# Patient Record
Sex: Male | Born: 1965 | Race: White | Hispanic: No | Smoking: Never smoker
Health system: Southern US, Community
[De-identification: ages and names within clinical notes are randomized; demographics above are authoritative.]

## PROBLEM LIST (undated history)

## (undated) DIAGNOSIS — R188 Other ascites: Secondary | ICD-10-CM

## (undated) DIAGNOSIS — E871 Hypo-osmolality and hyponatremia: Secondary | ICD-10-CM

## (undated) DIAGNOSIS — K7460 Unspecified cirrhosis of liver: Secondary | ICD-10-CM

## (undated) DIAGNOSIS — H3320 Serous retinal detachment, unspecified eye: Secondary | ICD-10-CM

## (undated) DIAGNOSIS — D649 Anemia, unspecified: Secondary | ICD-10-CM

## (undated) HISTORY — DX: Unspecified cirrhosis of liver: K74.60

## (undated) HISTORY — PX: TONSILLECTOMY: SUR1361

## (undated) HISTORY — DX: Other ascites: R18.8

## (undated) HISTORY — PX: HERNIA REPAIR: SHX51

## (undated) HISTORY — PX: COLONOSCOPY: SHX174

## (undated) HISTORY — PX: KNEE SURGERY: SHX244

---

## 2003-09-15 ENCOUNTER — Emergency Department (HOSPITAL_COMMUNITY): Admission: EM | Admit: 2003-09-15 | Discharge: 2003-09-15 | Payer: Self-pay | Admitting: Emergency Medicine

## 2016-12-29 ENCOUNTER — Emergency Department (HOSPITAL_COMMUNITY): Payer: Medicaid Other

## 2016-12-29 ENCOUNTER — Inpatient Hospital Stay (HOSPITAL_COMMUNITY)
Admission: EM | Admit: 2016-12-29 | Discharge: 2017-01-05 | DRG: 433 | Disposition: A | Discharge status: Skilled Nursing Facility | Payer: Medicaid Other | Attending: Internal Medicine | Admitting: Internal Medicine

## 2016-12-29 ENCOUNTER — Encounter (HOSPITAL_COMMUNITY): Payer: Self-pay | Admitting: *Deleted

## 2016-12-29 DIAGNOSIS — R188 Other ascites: Secondary | ICD-10-CM

## 2016-12-29 DIAGNOSIS — D696 Thrombocytopenia, unspecified: Secondary | ICD-10-CM | POA: Diagnosis present

## 2016-12-29 DIAGNOSIS — Z6832 Body mass index (BMI) 32.0-32.9, adult: Secondary | ICD-10-CM

## 2016-12-29 DIAGNOSIS — K769 Liver disease, unspecified: Secondary | ICD-10-CM

## 2016-12-29 DIAGNOSIS — K7460 Unspecified cirrhosis of liver: Secondary | ICD-10-CM | POA: Diagnosis present

## 2016-12-29 DIAGNOSIS — E663 Overweight: Secondary | ICD-10-CM | POA: Diagnosis present

## 2016-12-29 DIAGNOSIS — D689 Coagulation defect, unspecified: Secondary | ICD-10-CM | POA: Diagnosis present

## 2016-12-29 DIAGNOSIS — R17 Unspecified jaundice: Secondary | ICD-10-CM

## 2016-12-29 DIAGNOSIS — M7989 Other specified soft tissue disorders: Secondary | ICD-10-CM

## 2016-12-29 DIAGNOSIS — K7689 Other specified diseases of liver: Secondary | ICD-10-CM | POA: Diagnosis present

## 2016-12-29 DIAGNOSIS — R0600 Dyspnea, unspecified: Secondary | ICD-10-CM

## 2016-12-29 DIAGNOSIS — R7989 Other specified abnormal findings of blood chemistry: Secondary | ICD-10-CM

## 2016-12-29 DIAGNOSIS — E860 Dehydration: Secondary | ICD-10-CM | POA: Diagnosis present

## 2016-12-29 DIAGNOSIS — D539 Nutritional anemia, unspecified: Secondary | ICD-10-CM | POA: Diagnosis present

## 2016-12-29 DIAGNOSIS — R945 Abnormal results of liver function studies: Secondary | ICD-10-CM | POA: Diagnosis present

## 2016-12-29 DIAGNOSIS — D72829 Elevated white blood cell count, unspecified: Secondary | ICD-10-CM | POA: Diagnosis present

## 2016-12-29 DIAGNOSIS — E871 Hypo-osmolality and hyponatremia: Secondary | ICD-10-CM | POA: Diagnosis present

## 2016-12-29 DIAGNOSIS — T380X5A Adverse effect of glucocorticoids and synthetic analogues, initial encounter: Secondary | ICD-10-CM | POA: Diagnosis present

## 2016-12-29 DIAGNOSIS — D649 Anemia, unspecified: Secondary | ICD-10-CM | POA: Diagnosis present

## 2016-12-29 DIAGNOSIS — K7031 Alcoholic cirrhosis of liver with ascites: Principal | ICD-10-CM

## 2016-12-29 DIAGNOSIS — K7290 Hepatic failure, unspecified without coma: Secondary | ICD-10-CM | POA: Diagnosis present

## 2016-12-29 DIAGNOSIS — E8770 Fluid overload, unspecified: Secondary | ICD-10-CM | POA: Diagnosis present

## 2016-12-29 DIAGNOSIS — K7010 Alcoholic hepatitis without ascites: Secondary | ICD-10-CM | POA: Diagnosis present

## 2016-12-29 DIAGNOSIS — K7011 Alcoholic hepatitis with ascites: Secondary | ICD-10-CM | POA: Diagnosis present

## 2016-12-29 DIAGNOSIS — E876 Hypokalemia: Secondary | ICD-10-CM | POA: Diagnosis present

## 2016-12-29 HISTORY — DX: Unspecified cirrhosis of liver: K74.60

## 2016-12-29 LAB — CBC WITH DIFFERENTIAL/PLATELET
BASOS ABS: 0 10*3/uL (ref 0.0–0.1)
BASOS PCT: 0 %
EOS ABS: 0.2 10*3/uL (ref 0.0–0.7)
Eosinophils Relative: 1 %
HCT: 25 % — ABNORMAL LOW (ref 39.0–52.0)
Hemoglobin: 8.4 g/dL — ABNORMAL LOW (ref 13.0–17.0)
Lymphocytes Relative: 5 %
Lymphs Abs: 0.6 10*3/uL — ABNORMAL LOW (ref 0.7–4.0)
MCH: 32.6 pg (ref 26.0–34.0)
MCHC: 33.6 g/dL (ref 30.0–36.0)
MCV: 96.9 fL (ref 78.0–100.0)
Monocytes Absolute: 1 10*3/uL (ref 0.1–1.0)
Monocytes Relative: 8 %
NEUTROS PCT: 86 %
Neutro Abs: 10.8 10*3/uL — ABNORMAL HIGH (ref 1.7–7.7)
PLATELETS: 127 10*3/uL — AB (ref 150–400)
RBC: 2.58 MIL/uL — ABNORMAL LOW (ref 4.22–5.81)
RDW: 18 % — ABNORMAL HIGH (ref 11.5–15.5)
WBC: 12.6 10*3/uL — AB (ref 4.0–10.5)

## 2016-12-29 LAB — COMPREHENSIVE METABOLIC PANEL
ALT: 33 U/L (ref 17–63)
ANION GAP: 11 (ref 5–15)
AST: 131 U/L — ABNORMAL HIGH (ref 15–41)
Albumin: 2.4 g/dL — ABNORMAL LOW (ref 3.5–5.0)
Alkaline Phosphatase: 244 U/L — ABNORMAL HIGH (ref 38–126)
BUN: 22 mg/dL — ABNORMAL HIGH (ref 6–20)
CHLORIDE: 97 mmol/L — AB (ref 101–111)
CO2: 23 mmol/L (ref 22–32)
CREATININE: 1.14 mg/dL (ref 0.61–1.24)
Calcium: 8.1 mg/dL — ABNORMAL LOW (ref 8.9–10.3)
Glucose, Bld: 107 mg/dL — ABNORMAL HIGH (ref 65–99)
POTASSIUM: 3.4 mmol/L — AB (ref 3.5–5.1)
Sodium: 131 mmol/L — ABNORMAL LOW (ref 135–145)
Total Bilirubin: 17.2 mg/dL — ABNORMAL HIGH (ref 0.3–1.2)
Total Protein: 7.1 g/dL (ref 6.5–8.1)

## 2016-12-29 LAB — URINALYSIS, ROUTINE W REFLEX MICROSCOPIC
Glucose, UA: 50 mg/dL — AB
Hgb urine dipstick: NEGATIVE
Ketones, ur: NEGATIVE mg/dL
LEUKOCYTES UA: NEGATIVE
NITRITE: NEGATIVE
Protein, ur: NEGATIVE mg/dL
SPECIFIC GRAVITY, URINE: 1.028 (ref 1.005–1.030)
pH: 5 (ref 5.0–8.0)

## 2016-12-29 LAB — RETICULOCYTES
RBC.: 2.58 MIL/uL — ABNORMAL LOW (ref 4.22–5.81)
RETIC COUNT ABSOLUTE: 74.8 10*3/uL (ref 19.0–186.0)
Retic Ct Pct: 2.9 % (ref 0.4–3.1)

## 2016-12-29 LAB — BRAIN NATRIURETIC PEPTIDE: B Natriuretic Peptide: 108.9 pg/mL — ABNORMAL HIGH (ref 0.0–100.0)

## 2016-12-29 LAB — PROTIME-INR
INR: 1.4
PROTHROMBIN TIME: 17.3 s — AB (ref 11.4–15.2)

## 2016-12-29 LAB — TROPONIN I: TROPONIN I: 0.03 ng/mL — AB (ref <0.03)

## 2016-12-29 LAB — BILIRUBIN, DIRECT: BILIRUBIN DIRECT: 9.3 mg/dL — AB (ref 0.1–0.5)

## 2016-12-29 MED ORDER — THIAMINE HCL 100 MG/ML IJ SOLN
100.0000 mg | Freq: Every day | INTRAMUSCULAR | Status: DC
  Filled 2016-12-29: qty 2

## 2016-12-29 MED ORDER — OXYCODONE HCL 5 MG PO TABS: 5.0000 mg | ORAL_TABLET | ORAL | Status: DC | PRN

## 2016-12-29 MED ORDER — IOPAMIDOL (ISOVUE-300) INJECTION 61%
INTRAVENOUS | Status: AC
  Administered 2016-12-29: 100 mL
  Filled 2016-12-29: qty 100

## 2016-12-29 MED ORDER — ACETAMINOPHEN 325 MG PO TABS: 650.0000 mg | ORAL_TABLET | Freq: Four times a day (QID) | ORAL | Status: DC | PRN

## 2016-12-29 MED ORDER — BISACODYL 5 MG PO TBEC: 5.0000 mg | DELAYED_RELEASE_TABLET | Freq: Every day | ORAL | Status: DC | PRN

## 2016-12-29 MED ORDER — ONDANSETRON HCL 4 MG/2ML IJ SOLN: 4.0000 mg | Freq: Four times a day (QID) | INTRAMUSCULAR | Status: DC | PRN

## 2016-12-29 MED ORDER — LORAZEPAM 2 MG/ML IJ SOLN: 1.0000 mg | Freq: Four times a day (QID) | INTRAMUSCULAR | Status: AC | PRN

## 2016-12-29 MED ORDER — SODIUM CHLORIDE 0.9% FLUSH
3.0000 mL | Freq: Two times a day (BID) | INTRAVENOUS | Status: DC
  Administered 2016-12-29 – 2017-01-05 (×11): 3 mL via INTRAVENOUS

## 2016-12-29 MED ORDER — LORAZEPAM 1 MG PO TABS
1.0000 mg | ORAL_TABLET | Freq: Four times a day (QID) | ORAL | Status: AC | PRN
  Administered 2017-01-01: 1 mg via ORAL
  Filled 2016-12-29: qty 1

## 2016-12-29 MED ORDER — LORAZEPAM 1 MG PO TABS
0.0000 mg | ORAL_TABLET | Freq: Four times a day (QID) | ORAL | Status: AC
  Administered 2016-12-30 (×3): 1 mg via ORAL
  Administered 2016-12-31: 2 mg via ORAL
  Administered 2016-12-31: 1 mg via ORAL
  Administered 2016-12-31 (×2): 2 mg via ORAL
  Filled 2016-12-29: qty 1
  Filled 2016-12-29: qty 2
  Filled 2016-12-29 (×2): qty 1
  Filled 2016-12-29 (×2): qty 2
  Filled 2016-12-29: qty 1
  Filled 2016-12-29: qty 2
  Filled 2016-12-29: qty 1

## 2016-12-29 MED ORDER — ADULT MULTIVITAMIN W/MINERALS CH
1.0000 | ORAL_TABLET | Freq: Every day | ORAL | Status: DC
  Administered 2016-12-31 – 2017-01-05 (×6): 1 via ORAL
  Filled 2016-12-29 (×6): qty 1

## 2016-12-29 MED ORDER — FUROSEMIDE 10 MG/ML IJ SOLN
20.0000 mg | Freq: Two times a day (BID) | INTRAMUSCULAR | Status: DC
  Administered 2016-12-30 – 2017-01-02 (×8): 20 mg via INTRAVENOUS
  Filled 2016-12-29 (×8): qty 2

## 2016-12-29 MED ORDER — POTASSIUM CHLORIDE CRYS ER 20 MEQ PO TBCR
30.0000 meq | EXTENDED_RELEASE_TABLET | Freq: Once | ORAL | Status: AC
  Administered 2016-12-30: 30 meq via ORAL
  Filled 2016-12-29: qty 1

## 2016-12-29 MED ORDER — ENOXAPARIN SODIUM 40 MG/0.4ML ~~LOC~~ SOLN
40.0000 mg | Freq: Every day | SUBCUTANEOUS | Status: DC
  Administered 2016-12-30 – 2017-01-03 (×5): 40 mg via SUBCUTANEOUS
  Filled 2016-12-29 (×6): qty 0.4

## 2016-12-29 MED ORDER — ONDANSETRON HCL 4 MG PO TABS: 4.0000 mg | ORAL_TABLET | Freq: Four times a day (QID) | ORAL | Status: DC | PRN

## 2016-12-29 MED ORDER — ALBUMIN HUMAN 25 % IV SOLN
25.0000 g | Freq: Four times a day (QID) | INTRAVENOUS | Status: AC
  Administered 2016-12-30 (×4): 25 g via INTRAVENOUS
  Filled 2016-12-29 (×5): qty 100

## 2016-12-29 MED ORDER — LORAZEPAM 1 MG PO TABS
0.0000 mg | ORAL_TABLET | Freq: Two times a day (BID) | ORAL | Status: AC
  Administered 2017-01-01 – 2017-01-02 (×2): 1 mg via ORAL
  Filled 2016-12-29 (×4): qty 1

## 2016-12-29 MED ORDER — FOLIC ACID 1 MG PO TABS
1.0000 mg | ORAL_TABLET | Freq: Every day | ORAL | Status: DC
  Administered 2016-12-31 – 2017-01-05 (×6): 1 mg via ORAL
  Filled 2016-12-29 (×6): qty 1

## 2016-12-29 MED ORDER — ACETAMINOPHEN 650 MG RE SUPP: 650.0000 mg | Freq: Four times a day (QID) | RECTAL | Status: DC | PRN

## 2016-12-29 MED ORDER — POLYETHYLENE GLYCOL 3350 17 G PO PACK
17.0000 g | PACK | Freq: Every day | ORAL | Status: DC | PRN
Start: 2016-12-29 — End: 2017-01-05

## 2016-12-29 MED ORDER — VITAMIN B-1 100 MG PO TABS
100.0000 mg | ORAL_TABLET | Freq: Every day | ORAL | Status: DC
  Administered 2016-12-31 – 2017-01-05 (×6): 100 mg via ORAL
  Filled 2016-12-29 (×6): qty 1

## 2016-12-29 MED ORDER — ALBUMIN HUMAN 25 % IV SOLN
50.0000 g | Freq: Once | INTRAVENOUS | Status: DC | PRN
  Filled 2016-12-29: qty 200

## 2016-12-29 NOTE — H&P (Signed)
History and Physical    Richard Bird LPF:[id number] DOB: 07-14-1966 DOA: 12/29/2016  PCP: Chevis Pretty, MD   Patient coming from: Home  Chief Complaint: Jaundice, abd distension, SOB, BLE edema   HPI: Richard Bird is a 51 y.o. male with medical history significant for long-standing alcohol dependence who presents the emergency department for evaluation of general weakness, shortness of breath, jaundice, abdominal distention, and bilateral lower extremity edema. Patient reports a long history of daily alcohol use, approximately 5-10 drinks per day and had otherwise been in his usual state of health until approximately 3 weeks ago when he noted the insidious development of right lower extremity swelling. Over the next week or so, he developed swelling in the left leg as well and also noted progressive abdominal distention and yellowing of his skin and eyes. Over the ensuing days, all of these complaints were progressively worsening and the patient was becoming increasingly short of breath with exertion. He denies any fevers or chills, denies chest pain or palpitations, and denies dyspnea while at rest, or cough. There has been no significant nausea, no vomiting, no diarrhea, and no melena or hematochezia. Patient denies any use of illicit substances and denies any history of IV drug abuse. He denies a family history of liver disease. He reports abstaining from alcohol for up to a couple months at a time remotely, but has not gone a day without drinking in many months. He denies history of DTs or seizure, but has noted mild withdrawal symptoms in the past which she has relieved with drinking.  ED Course: Upon arrival to the ED, patient is found to be afebrile, saturating well on room air, tachycardic and low 100s, and mildly hypertensive. EKG featured sinus tachycardia with rate 101 and nonspecific T-wave abnormality in the inferior leads. Chest x-ray features low-volume lungs with bibasilar  atelectasis versus scarring. Chemistry panels notable for sodium of 131, potassium 3.4, alkaline phosphatase 244, albumin 2.4, AST 131, ALT 33, and total bilirubin 17.2. CBC features a leukocytosis to 12,600 with a normocytic anemia and thrombocytopenia, hemoglobin 8.4 and platelets 127,000 with no priors available for comparison. CT of the abdomen and pelvis was obtained and findings are consistent with cirrhosis with moderate volume ascites and evidence of portal hypertension. Also noted on CT is 2.4 cm hypodense lesion in the central liver with MRI advised for further evaluation. Patient remained hematologically stable in the ED and has not been in acute respiratory distress. He will be admitted to the telemetry unit for ongoing evaluation and management of acute alcoholic hepatitis/decompensated cirrhosis with anasarca.  Review of Systems:  All other systems reviewed and apart from HPI, are negative.  History reviewed. No pertinent past medical history.  Past Surgical History:  Procedure Laterality Date  . HERNIA REPAIR    . KNEE SURGERY       reports that he has never smoked. He has never used smokeless tobacco. He reports that he drinks about 25.2 oz of alcohol per week . He reports that he does not use drugs.  Allergies  Allergen Reactions  . Penicillins     51 years old, unknown reaction     History reviewed. No pertinent family history.   Prior to Admission medications   Medication Sig Start Date End Date Taking? Authorizing Provider  Naproxen Sod-Diphenhydramine (ALEVE PM) 220-25 MG TABS Take 1 tablet by mouth at bedtime as needed (sleep).   Yes Historical Provider, MD    Physical Exam: Vitals:   12/29/16 2144 12/29/16  2200 12/29/16 2217 12/29/16 2230  BP: 130/87 (!) 151/85 (!) 151/85 (!) 154/90  Pulse: (!) 124 (!) 103 99 (!) 105  Resp: (!) 26 18 20 15   Temp:      TempSrc:      SpO2: 96% 96% 97% 95%  Weight:          Constitutional: No respiratory distress, calm,  appears uncomfortable. Jaundice, anasarca noted Eyes: PERTLA, lids normal. Scleral icterus.  ENMT: Mucous membranes are moist. Posterior pharynx clear of any exudate or lesions.   Neck: normal, supple, no masses, no thyromegaly Respiratory: clear to auscultation bilaterally, no wheezing, no crackles. Normal respiratory effort.   Cardiovascular: S1 & S2 heard, regular rate and rhythm. Marked pretibial edema b/l, Rt >> Lt. No significant JVD. Abdomen: Distended with fluid wave, mild generalized tenderness without rebound pain or guarding. Bowel sounds normal.  Musculoskeletal: no clubbing / cyanosis. No joint deformity upper and lower extremities.   Skin: no significant rashes, lesions, ulcers. Warm, dry, well-perfused. Jaundiced throughout.  Neurologic: CN 2-12 grossly intact. Sensation intact, DTR normal. Strength 5/5 in all 4 limbs.  Psychiatric: Alert and oriented x 3. Normal mood and affect.     Labs on Admission: I have personally reviewed following labs and imaging studies  CBC:  Recent Labs Lab 12/29/16 1940  WBC 12.6*  NEUTROABS 10.8*  HGB 8.4*  HCT 25.0*  MCV 96.9  PLT 211*   Basic Metabolic Panel:  Recent Labs Lab 12/29/16 1940  NA 131*  K 3.4*  CL 97*  CO2 23  GLUCOSE 107*  BUN 22*  CREATININE 1.14  CALCIUM 8.1*   GFR: CrCl cannot be calculated (Unknown ideal weight.). Liver Function Tests:  Recent Labs Lab 12/29/16 1940  AST 131*  ALT 33  ALKPHOS 244*  BILITOT 17.2*  PROT 7.1  ALBUMIN 2.4*   No results for input(s): LIPASE, AMYLASE in the last 168 hours. No results for input(s): AMMONIA in the last 168 hours. Coagulation Profile:  Recent Labs Lab 12/29/16 1940  INR 1.40   Cardiac Enzymes:  Recent Labs Lab 12/29/16 1940  TROPONINI 0.03*   BNP (last 3 results) No results for input(s): PROBNP in the last 8760 hours. HbA1C: No results for input(s): HGBA1C in the last 72 hours. CBG: No results for input(s): GLUCAP in the last 168  hours. Lipid Profile: No results for input(s): CHOL, HDL, LDLCALC, TRIG, CHOLHDL, LDLDIRECT in the last 72 hours. Thyroid Function Tests: No results for input(s): TSH, T4TOTAL, FREET4, T3FREE, THYROIDAB in the last 72 hours. Anemia Panel: No results for input(s): VITAMINB12, FOLATE, FERRITIN, TIBC, IRON, RETICCTPCT in the last 72 hours. Urine analysis:    Component Value Date/Time   COLORURINE AMBER (A) 12/29/2016 2150   APPEARANCEUR HAZY (A) 12/29/2016 2150   LABSPEC 1.028 12/29/2016 2150   PHURINE 5.0 12/29/2016 2150   GLUCOSEU 50 (A) 12/29/2016 2150   HGBUR NEGATIVE 12/29/2016 2150   BILIRUBINUR MODERATE (A) 12/29/2016 2150   KETONESUR NEGATIVE 12/29/2016 2150   PROTEINUR NEGATIVE 12/29/2016 2150   NITRITE NEGATIVE 12/29/2016 2150   LEUKOCYTESUR NEGATIVE 12/29/2016 2150   Sepsis Labs: @LABRCNTIP (procalcitonin:4,lacticidven:4) )No results found for this or any previous visit (from the past 240 hour(s)).   Radiological Exams on Admission: Dg Chest 2 View  Result Date: 12/29/2016 CLINICAL DATA:  Shortness of breath, fatigue, and lower extremity swelling for 2 weeks. Jaundice. EXAM: CHEST  2 VIEW COMPARISON:  None. FINDINGS: The heart size and mediastinal contours are within normal limits. Low lung volumes  noted. Linear opacities in both lung bases may be due to atelectasis or scarring. No evidence of pulmonary consolidation or edema. No evidence of pneumothorax or pleural effusion. IMPRESSION: Low lung volumes, with bibasilar atelectasis versus scarring. Electronically Signed   By: Earle Gell M.D.   On: 12/29/2016 20:04   Ct Abdomen Pelvis W Contrast  Result Date: 12/29/2016 CLINICAL DATA:  Initial evaluation for acute generalized weakness with fatigue. Evaluate for cirrhosis. EXAM: CT ABDOMEN AND PELVIS WITH CONTRAST TECHNIQUE: Multidetector CT imaging of the abdomen and pelvis was performed using the standard protocol following bolus administration of intravenous contrast.  CONTRAST:  122m ISOVUE-300 IOPAMIDOL (ISOVUE-300) INJECTION 61% COMPARISON:  None available. FINDINGS: Lower chest: Scattered patchy and linear atelectatic changes present within the visualized lung bases. Visualized lung bases are otherwise clear. Extensive 3 vessel coronary artery calcifications noted. Hepatobiliary: The liver is heterogeneous in appearance with a nodular contour and caudate lobe hypertrophy, compatible with cirrhosis. Superimposed 2.4 cm hypodense lesion noted within the central aspect of the liver (series 2, image 28), indeterminate. No other definite focal lesions identified. Small granuloma noted. Evidence of portal hypertension with the recannulized paraumbilical vein. Large varix noted within the left abdomen as well. Few scattered smaller varices present within the upper abdomen. Trace hyperdensity within the gallbladder lumen, likely small stone. No biliary dilatation. Pancreas: Pancreas within normal limits. Spleen: Spleen is enlarged measuring 15.2 cm in craniocaudad dimension. Adrenals/Urinary Tract: Adrenal glands are normal. Kidneys equal in size with symmetric enhancement. No nephrolithiasis, hydronephrosis, or focal enhancing renal mass. No hydroureter. Bladder within normal limits. Stomach/Bowel: Stomach within normal limits. No evidence for bowel obstruction. Scattered hazy stranding and fluid about the small and large bowel felt to be related to underlying intrinsic liver disease. Colonic diverticulosis without definite evidence for acute diverticulitis. Appendix is normal. No definite acute inflammatory changes about the bowels. Vascular/Lymphatic: Extensive aorto bi-iliac atherosclerotic disease. No aneurysm. No pathologically enlarged intra-abdominal or pelvic lymph nodes. Reproductive: Prostate somewhat enlarged with median lobe hypertrophy, invaginating upon the base of the bladder. Other: No free intraperitoneal air. Moderate volume ascites within the abdomen and pelvis.  Small amount of fluid extends through a small paraumbilical hernia. Musculoskeletal:  Anasarca noted. No acute osseous abnormality. No worrisome lytic or blastic osseous lesions. Degenerative spondylolysis noted within the lumbar spine, greatest at L5-S1. IMPRESSION: 1. Cirrhosis with moderate volume ascites and evidence of portal hypertension as detailed above. 2. 2.4 cm hypodense lesion within the central aspect of the liver, indeterminate. Given the underlying intrinsic liver disease, further evaluation with dedicated MRI is recommended. 3. Colonic diverticulosis without definite evidence for acute diverticulitis. 4. Cholelithiasis. 5. Extensive coronary artery calcifications with moderate aorto bi-iliac atherosclerotic disease. Electronically Signed   By: BJeannine BogaM.D.   On: 12/29/2016 21:34    EKG: Independently reviewed. Sinus tachycardia (rate 101), non-specific T-wave abnormality inferiorly   Assessment/Plan  1. Alcoholic hepatitis/decompensated cirrhosis  - Pt with long hx of heavy daily alcohol use, now p/w jaundice, ascites, anasarca - Noted to have t bili 17 with alk phos 244, AST 131, and ALT 33, INR 1.40 - CT abd/pelvis reveals cirrhotic liver, evidence for portal HTN, and central hypodense liver lesion - Pt was counseled regarding critical importance of abstinence from alcohol going forward; family at bedside are very supportive - Plan to start acid-suppression, replete lytes, supplement vitamins and minerals, monitor with CIWA and prn Ativan - US-guided paracentesis with fluid analysis requested   - Maddrey discriminant function score is 39  on admission; viral hep panel pending, and if neg, may benefit from steroid    2. Liver lesion  - 2.4 cm hypodense lesion noted in central liver on CT - Further characterize with MRI, check AFP   3. Hyponatremia - Serum sodium 131 on admission - Secondary to cirrhosis with reduced effective arterial blood volume - Repeat chem  panel in am   4. Hypokalemia  - Serum potassium is 3.4 on admission - 30 mEq oral given; repeat chem panel in am   5. Coagulapathy  - INR 1.40 on admission without any apparent bleeding  - Likely secondary to liver disease; will monitor   6. Anemia, thrombocytopenia  - Hgb is 8.4 on admission and platelets 127k  - There is no apparent bleeding, and no pallor  - Likely secondary to alcohol abuse and liver disease - Type and screen performed; anemia panel sent   7. Anasarca  - Pt presents with anasarca secondary to decompensated cirrhosis  - Planning for paracentesis as above  - Albumin only 2.4 and albumin will be given q6h with Lasix 20 mg IV q12h  - Follow daily wts and I/O's  - RLE much larger than left and venous US will be performed to exclude DVT    DVT prophylaxis: sq Lovenox  Code Status: Full  Family Communication: Wife and son updated at bedside Disposition Plan: Admit to telemetry Consults called: None Admission status: Inpatient    Vianne Bulls, MD [facility] Pager [phone]  If 7PM-7AM, please contact night-coverage [email or web address] Password [facility]  12/29/2016, 10:48 PM

## 2016-12-29 NOTE — ED Provider Notes (Signed)
[facility]-EMERGENCY DEPT Provider Note   CSN: [id number] Arrival date & time: 12/29/16  1837     History   Chief Complaint Chief Complaint  Patient presents with  . Jaundice    HPI Richard Bird is a 51 y.o. male.  51 year old male with a long-standing alcohol history drugs currently 6 shots a day presents to emergency department today with jaundice and lower show any swelling with abdominal distention. He states it started about 2 weeks ago and progressively worsening. Has never had this before. No weight loss or change in appetite. No significant pruritus. No other associated or modifying symptoms. No family or personal history of cancer.      History reviewed. No pertinent past medical history.  Patient Active Problem List   Diagnosis Date Noted  . Alcoholic cirrhosis of liver with ascites (HCC) 12/29/2016  . Hyponatremia 12/29/2016  . Hypokalemia 12/29/2016  . LFTs abnormal 12/29/2016  . Coagulopathy (HCC) 12/29/2016  . Normocytic anemia 12/29/2016  . Decompensated hepatic cirrhosis (HCC) 12/29/2016  . Hepatitis, alcoholic, acute 12/29/2016    Past Surgical History:  Procedure Laterality Date  . HERNIA REPAIR    . KNEE SURGERY         Home Medications    Prior to Admission medications   Medication Sig Start Date End Date Taking? Authorizing Provider  Naproxen Sod-Diphenhydramine (ALEVE PM) 220-25 MG TABS Take 1 tablet by mouth at bedtime as needed (sleep).   Yes Historical Provider, MD    Family History History reviewed. No pertinent family history.  Social History Social History  Substance Use Topics  . Smoking status: Never Smoker  . Smokeless tobacco: Never Used  . Alcohol use 25.2 oz/week    42 Shots of liquor per week     Allergies   Penicillins   Review of Systems Review of Systems  All other systems reviewed and are negative.    Physical Exam Updated Vital Signs BP (!) 154/90   Pulse (!) 105   Temp 98.1 F (36.7 C) (Oral)    Resp 15   Wt 237 lb 9.6 oz (107.8 kg)   SpO2 95%   Physical Exam  Constitutional: He appears well-developed and well-nourished.  HENT:  Head: Normocephalic and atraumatic.  Eyes: EOM are normal. Pupils are equal, round, and reactive to light. Scleral icterus is present.  Neck: Normal range of motion.  Cardiovascular: Tachycardia present.   Pulmonary/Chest: Effort normal. No respiratory distress.  Abdominal: He exhibits distension. There is no tenderness. There is no guarding.  Musculoskeletal: Normal range of motion. He exhibits edema (2+ to knees).  Neurological: He is alert.  Skin:  Jaundice  Nursing note and vitals reviewed.    ED Treatments / Results  Labs (all labs ordered are listed, but only abnormal results are displayed) Labs Reviewed  CBC WITH DIFFERENTIAL/PLATELET - Abnormal; Notable for the following:       Result Value   WBC 12.6 (*)    RBC 2.58 (*)    Hemoglobin 8.4 (*)    HCT 25.0 (*)    RDW 18.0 (*)    Platelets 127 (*)    Neutro Abs 10.8 (*)    Lymphs Abs 0.6 (*)    All other components within normal limits  COMPREHENSIVE METABOLIC PANEL - Abnormal; Notable for the following:    Sodium 131 (*)    Potassium 3.4 (*)    Chloride 97 (*)    Glucose, Bld 107 (*)    BUN 22 (*)  Calcium 8.1 (*)    Albumin 2.4 (*)    AST 131 (*)    Alkaline Phosphatase 244 (*)    Total Bilirubin 17.2 (*)    All other components within normal limits  URINALYSIS, ROUTINE W REFLEX MICROSCOPIC - Abnormal; Notable for the following:    Color, Urine AMBER (*)    APPearance HAZY (*)    Glucose, UA 50 (*)    Bilirubin Urine MODERATE (*)    All other components within normal limits  BILIRUBIN, DIRECT - Abnormal; Notable for the following:    Bilirubin, Direct 9.3 (*)    All other components within normal limits  PROTIME-INR - Abnormal; Notable for the following:    Prothrombin Time 17.3 (*)    All other components within normal limits  BRAIN NATRIURETIC PEPTIDE - Abnormal;  Notable for the following:    B Natriuretic Peptide 108.9 (*)    All other components within normal limits  TROPONIN I - Abnormal; Notable for the following:    Troponin I 0.03 (*)    All other components within normal limits  HEPATITIS PANEL, ACUTE  HIV ANTIBODY (ROUTINE TESTING)  MAGNESIUM  COMPREHENSIVE METABOLIC PANEL  CBC WITH DIFFERENTIAL/PLATELET  PROTIME-INR  VITAMIN B12  FOLATE  IRON AND TIBC  FERRITIN  RETICULOCYTES  TYPE AND SCREEN    EKG  EKG Interpretation  Date/Time:  Wednesday December 29 2016 19:30:13 EDT Ventricular Rate:  101 PR Interval:    QRS Duration: 95 QT Interval:  360 QTC Calculation: 467 R Axis:   60 Text Interpretation:  Age not entered, assumed to be  51 years old for purpose of ECG interpretation Sinus tachycardia Probable left atrial enlargement Nonspecific T abnormalities, inferior leads somewhat similar to 2004 Confirmed by [facility] MD, Barbara Cower [phone]) on 12/29/2016 8:04:40 PM       Radiology Dg Chest 2 View  Result Date: 12/29/2016 CLINICAL DATA:  Shortness of breath, fatigue, and lower extremity swelling for 2 weeks. Jaundice. EXAM: CHEST  2 VIEW COMPARISON:  None. FINDINGS: The heart size and mediastinal contours are within normal limits. Low lung volumes noted. Linear opacities in both lung bases may be due to atelectasis or scarring. No evidence of pulmonary consolidation or edema. No evidence of pneumothorax or pleural effusion. IMPRESSION: Low lung volumes, with bibasilar atelectasis versus scarring. Electronically Signed   By: Myles Rosenthal M.D.   On: 12/29/2016 20:04   Ct Abdomen Pelvis W Contrast  Result Date: 12/29/2016 CLINICAL DATA:  Initial evaluation for acute generalized weakness with fatigue. Evaluate for cirrhosis. EXAM: CT ABDOMEN AND PELVIS WITH CONTRAST TECHNIQUE: Multidetector CT imaging of the abdomen and pelvis was performed using the standard protocol following bolus administration of intravenous contrast. CONTRAST:   ISOVUE-300 IOPAMIDOL (ISOVUE-300) INJECTION 61% COMPARISON:  None available. FINDINGS: Lower chest: Scattered patchy and linear atelectatic changes present within the visualized lung bases. Visualized lung bases are otherwise clear. Extensive 3 vessel coronary artery calcifications noted. Hepatobiliary: The liver is heterogeneous in appearance with a nodular contour and caudate lobe hypertrophy, compatible with cirrhosis. Superimposed 2.4 cm hypodense lesion noted within the central aspect of the liver (series 2, image 28), indeterminate. No other definite focal lesions identified. Small granuloma noted. Evidence of portal hypertension with the recannulized paraumbilical vein. Large varix noted within the left abdomen as well. Few scattered smaller varices present within the upper abdomen. Trace hyperdensity within the gallbladder lumen, likely small stone. No biliary dilatation. Pancreas: Pancreas within normal limits. Spleen: Spleen is enlarged measuring  15.2 cm in craniocaudad dimension. Adrenals/Urinary Tract: Adrenal glands are normal. Kidneys equal in size with symmetric enhancement. No nephrolithiasis, hydronephrosis, or focal enhancing renal mass. No hydroureter. Bladder within normal limits. Stomach/Bowel: Stomach within normal limits. No evidence for bowel obstruction. Scattered hazy stranding and fluid about the small and large bowel felt to be related to underlying intrinsic liver disease. Colonic diverticulosis without definite evidence for acute diverticulitis. Appendix is normal. No definite acute inflammatory changes about the bowels. Vascular/Lymphatic: Extensive aorto bi-iliac atherosclerotic disease. No aneurysm. No pathologically enlarged intra-abdominal or pelvic lymph nodes. Reproductive: Prostate somewhat enlarged with median lobe hypertrophy, invaginating upon the base of the bladder. Other: No free intraperitoneal air. Moderate volume ascites within the abdomen and pelvis. Small amount of  fluid extends through a small paraumbilical hernia. Musculoskeletal:  Anasarca noted. No acute osseous abnormality. No worrisome lytic or blastic osseous lesions. Degenerative spondylolysis noted within the lumbar spine, greatest at L5-S1. IMPRESSION: 1. Cirrhosis with moderate volume ascites and evidence of portal hypertension as detailed above. 2. 2.4 cm hypodense lesion within the central aspect of the liver, indeterminate. Given the underlying intrinsic liver disease, further evaluation with dedicated MRI is recommended. 3. Colonic diverticulosis without definite evidence for acute diverticulitis. 4. Cholelithiasis. 5. Extensive coronary artery calcifications with moderate aorto bi-iliac atherosclerotic disease. Electronically Signed   By: Rise Mu M.D.   On: 12/29/2016 21:34    Procedures Procedures (including critical care time)  Medications Ordered in ED Medications  LORazepam (ATIVAN) tablet 1 mg (not administered)    Or  LORazepam (ATIVAN) injection 1 mg (not administered)  thiamine (VITAMIN B-1) tablet 100 mg (not administered)    Or  thiamine (B-1) injection 100 mg (not administered)  folic acid (FOLVITE) tablet 1 mg (not administered)  multivitamin with minerals tablet 1 tablet (not administered)  LORazepam (ATIVAN) tablet 0-4 mg (not administered)    Followed by  LORazepam (ATIVAN) tablet 0-4 mg (not administered)  enoxaparin (LOVENOX) injection 40 mg (not administered)  sodium chloride flush (NS) 0.9 % injection 3 mL (not administered)  acetaminophen (TYLENOL) tablet 650 mg (not administered)    Or  acetaminophen (TYLENOL) suppository 650 mg (not administered)  oxyCODONE (Oxy IR/ROXICODONE) immediate release tablet 5-10 mg (not administered)  polyethylene glycol (MIRALAX / GLYCOLAX) packet 17 g (not administered)  bisacodyl (DULCOLAX) EC tablet 5 mg (not administered)  ondansetron (ZOFRAN) tablet 4 mg (not administered)    Or  ondansetron (ZOFRAN) injection 4 mg  (not administered)  albumin human 25 % solution 50 g (not administered)  albumin human 25 % solution 25 g (not administered)  furosemide (LASIX) injection 20 mg (not administered)  potassium chloride (K-DUR,KLOR-CON) CR tablet 30 mEq (not administered)  iopamidol (ISOVUE-300) 61 % injection (100 mLs  Contrast Given 12/29/16 2049)     Initial Impression / Assessment and Plan / ED Course  I have reviewed the triage vital signs and the nursing notes.  Pertinent labs & imaging results that were available during my care of the patient were reviewed by me and considered in my medical decision making (see chart for details).     Liver failure with likely heart failure as well. Likely 2/2 cirrhosis/alcohol but has some hypodense lesion that could be related to cancer, recommending MRI. Also needs cardiac workup so will admit for same.   Final Clinical Impressions(s) / ED Diagnoses   Final diagnoses:  Hyperbilirubinemia  Jaundice  Cirrhosis of liver with ascites, unspecified hepatic cirrhosis type (HCC)  Swelling of lower extremity  Marily Memos, MD 12/29/16 [phone]

## 2016-12-29 NOTE — ED Notes (Signed)
Pt has a urinal and will give [place] a specimen as soon as he is able

## 2016-12-29 NOTE — ED Notes (Signed)
Pt stated he still cannot urinate

## 2016-12-29 NOTE — ED Notes (Signed)
ED Provider at bedside. 

## 2016-12-29 NOTE — ED Triage Notes (Signed)
Pt is here due to generalized weakness and fatigue which he notes has been increasing x 2weeks.  Pt is jaundiced with very yellowed eyes and skin x2 weeks.  Pt has bilateral lower extremity edema which has been in place for 3 weeks. Pt also has distended hard abdomen (appearance of ascites).  Pt also notes that he becomes sob when ambulating up stairs.  Pt drinks daily and has about 6 shots of liquor each day.  Pt had his last drink last night.

## 2016-12-29 NOTE — ED Notes (Signed)
Patient transported to X-ray 

## 2016-12-30 ENCOUNTER — Inpatient Hospital Stay (HOSPITAL_COMMUNITY): Payer: Medicaid Other

## 2016-12-30 DIAGNOSIS — M7989 Other specified soft tissue disorders: Secondary | ICD-10-CM

## 2016-12-30 DIAGNOSIS — K769 Liver disease, unspecified: Secondary | ICD-10-CM

## 2016-12-30 DIAGNOSIS — K7031 Alcoholic cirrhosis of liver with ascites: Principal | ICD-10-CM

## 2016-12-30 DIAGNOSIS — R17 Unspecified jaundice: Secondary | ICD-10-CM

## 2016-12-30 LAB — CBC WITH DIFFERENTIAL/PLATELET
BASOS PCT: 0 %
Basophils Absolute: 0 10*3/uL (ref 0.0–0.1)
Eosinophils Absolute: 0.2 10*3/uL (ref 0.0–0.7)
Eosinophils Relative: 1 %
HCT: 21.8 % — ABNORMAL LOW (ref 39.0–52.0)
HEMOGLOBIN: 7.4 g/dL — AB (ref 13.0–17.0)
LYMPHS ABS: 1 10*3/uL (ref 0.7–4.0)
Lymphocytes Relative: 7 %
MCH: 33.5 pg (ref 26.0–34.0)
MCHC: 33.9 g/dL (ref 30.0–36.0)
MCV: 98.6 fL (ref 78.0–100.0)
MONO ABS: 0.5 10*3/uL (ref 0.1–1.0)
MONOS PCT: 4 %
NEUTROS ABS: 11.2 10*3/uL — AB (ref 1.7–7.7)
NEUTROS PCT: 87 %
Platelets: 109 10*3/uL — ABNORMAL LOW (ref 150–400)
RBC: 2.21 MIL/uL — ABNORMAL LOW (ref 4.22–5.81)
RDW: 18.4 % — ABNORMAL HIGH (ref 11.5–15.5)
WBC Morphology: INCREASED
WBC: 12.4 10*3/uL — ABNORMAL HIGH (ref 4.0–10.5)

## 2016-12-30 LAB — PROTIME-INR
INR: 1.41
PROTHROMBIN TIME: 17.3 s — AB (ref 11.4–15.2)

## 2016-12-30 LAB — FERRITIN: Ferritin: 73 ng/mL (ref 24–336)

## 2016-12-30 LAB — BODY FLUID CELL COUNT WITH DIFFERENTIAL
Lymphs, Fluid: 16 %
Monocyte-Macrophage-Serous Fluid: 82 % (ref 50–90)
NEUTROPHIL FLUID: 2 % (ref 0–25)
Total Nucleated Cell Count, Fluid: 91 cu mm (ref 0–1000)

## 2016-12-30 LAB — COMPREHENSIVE METABOLIC PANEL
ALBUMIN: 2.5 g/dL — AB (ref 3.5–5.0)
ALK PHOS: 224 U/L — AB (ref 38–126)
ALT: 31 U/L (ref 17–63)
ANION GAP: 12 (ref 5–15)
AST: 129 U/L — ABNORMAL HIGH (ref 15–41)
BUN: 22 mg/dL — ABNORMAL HIGH (ref 6–20)
CALCIUM: 7.9 mg/dL — AB (ref 8.9–10.3)
CHLORIDE: 98 mmol/L — AB (ref 101–111)
CO2: 21 mmol/L — AB (ref 22–32)
CREATININE: 1.01 mg/dL (ref 0.61–1.24)
GFR calc Af Amer: >60 mL/min (ref >60)
GFR calc non Af Amer: >60 mL/min (ref >60)
GLUCOSE: 105 mg/dL — AB (ref 65–99)
Potassium: 3.3 mmol/L — ABNORMAL LOW (ref 3.5–5.1)
SODIUM: 131 mmol/L — AB (ref 135–145)
Total Bilirubin: 16.8 mg/dL — ABNORMAL HIGH (ref 0.3–1.2)
Total Protein: 7.2 g/dL (ref 6.5–8.1)

## 2016-12-30 LAB — TYPE AND SCREEN
ABO/RH(D): AB NEG
ANTIBODY SCREEN: NEGATIVE

## 2016-12-30 LAB — ALBUMIN, PLEURAL OR PERITONEAL FLUID: Albumin, Fluid: <1 g/dL

## 2016-12-30 LAB — GLUCOSE, PLEURAL OR PERITONEAL FLUID: Glucose, Fluid: 95 mg/dL

## 2016-12-30 LAB — LACTATE DEHYDROGENASE, PLEURAL OR PERITONEAL FLUID: LD FL: 55 U/L — AB (ref 3–23)

## 2016-12-30 LAB — HIV ANTIBODY (ROUTINE TESTING W REFLEX): HIV Screen 4th Generation wRfx: NONREACTIVE

## 2016-12-30 LAB — FOLATE: FOLATE: 14.6 ng/mL (ref >5.9)

## 2016-12-30 LAB — PROTEIN, PLEURAL OR PERITONEAL FLUID: Total protein, fluid: <3 g/dL

## 2016-12-30 LAB — IRON AND TIBC
IRON: 30 ug/dL — AB (ref 45–182)
Saturation Ratios: 12 % — ABNORMAL LOW (ref 17.9–39.5)
TIBC: 246 ug/dL — AB (ref 250–450)
UIBC: 216 ug/dL

## 2016-12-30 LAB — MAGNESIUM: MAGNESIUM: 1.7 mg/dL (ref 1.7–2.4)

## 2016-12-30 LAB — ABO/RH: ABO/RH(D): AB NEG

## 2016-12-30 LAB — VITAMIN B12: VITAMIN B 12: 1748 pg/mL — AB (ref 180–914)

## 2016-12-30 MED ORDER — FAMOTIDINE 20 MG PO TABS
20.0000 mg | ORAL_TABLET | Freq: Two times a day (BID) | ORAL | Status: DC
  Administered 2016-12-30 – 2017-01-05 (×11): 20 mg via ORAL
  Filled 2016-12-30 (×12): qty 1

## 2016-12-30 MED ORDER — GADOXETATE DISODIUM 0.25 MMOL/ML IV SOLN
10.0000 mL | Freq: Once | INTRAVENOUS | Status: AC | PRN
  Administered 2016-12-30: 10 mL via INTRAVENOUS

## 2016-12-30 MED ORDER — POTASSIUM CHLORIDE CRYS ER 20 MEQ PO TBCR
40.0000 meq | EXTENDED_RELEASE_TABLET | Freq: Two times a day (BID) | ORAL | Status: DC
  Administered 2016-12-30 – 2017-01-03 (×8): 40 meq via ORAL
  Filled 2016-12-30 (×8): qty 2

## 2016-12-30 NOTE — Procedures (Signed)
Ultrasound-guided diagnostic and therapeutic paracentesis performed yielding 2.9  liters of golden yellow fluid. No immediate complications. A portion of the fluid was sent to the lab for preordered studies.

## 2016-12-30 NOTE — Progress Notes (Signed)
*  Preliminary Results* Right lower extremity venous duplex completed. Right lower extremity is negative for deep vein thrombosis. There is no evidence of right Baker's cyst.  12/30/2016 3:24 PM  Gertie Fey, BS, RVT, RDCS, RDMS

## 2016-12-30 NOTE — Progress Notes (Signed)
PROGRESS NOTE    Richard Bird  ONG:[id number] DOB: [birth year] DOA: 12/29/2016 PCP: Chevis Pretty, MD  Brief Narrative:  Richard Bird is a 51 y.o. male with medical history significant for long-standing alcohol dependence who presents the emergency department for evaluation of general weakness, shortness of breath, jaundice, abdominal distention, and bilateral lower extremity edema. Patient reports a long history of daily alcohol use, approximately 5-10 drinks per day and had otherwise been in his usual state of health until approximately 3 weeks ago when he noted the insidious development of right lower extremity swelling. Over the next week or so, he developed swelling in the left leg as well and also noted progressive abdominal distention and yellowing of his skin and eyes. Over the ensuing days, all of these complaints were progressively worsening and the patient was becoming increasingly short of breath with exertion. He denies any fevers or chills, denies chest pain or palpitations, and denies dyspnea while at rest, or cough. There has been no significant nausea, no vomiting, no diarrhea, and no melena or hematochezia. Patient denies any use of illicit substances and denies any history of IV drug abuse. He denies a family history of liver disease. He reports abstaining from alcohol for up to a couple months at a time remotely, but has not gone a day without drinking in many months. He denies history of DTs or seizure, but has noted mild withdrawal symptoms in the past which she has relieved with drinking. Admitted for Decompensated Cirrhosis and found to have a liver Lesion.  Assessment & Plan:   Principal Problem:   Cirrhosis of liver with ascites (HCC) Active Problems:   Alcoholic cirrhosis of liver with ascites (HCC)   Hyponatremia   Hypokalemia   LFTs abnormal   Coagulopathy (HCC)   Normocytic anemia   Hepatitis, alcoholic, acute   Liver lesion  1. Alcoholic  hepatitis/decompensated cirrhosis  - Pt with long hx of heavy daily alcohol use, now p/w jaundice, ascites, anasarca - Noted to have t bili 17 with alk phos 244, AST 131, and ALT 33, INR 1.40 -Repet TBili decreasing slightly (16.8) along with Alk Phos (244) and AST (129) - CT abd/pelvis reveals cirrhotic liver, evidence for portal HTN, and central hypodense liver lesion - Pt was counseled regarding critical importance of abstinence from alcohol going forward; family at bedside are very supportive - Plan to start acid-suppression, replete lytes, supplement vitamins and minerals, monitor with CIWA and prn Ativan - US-guided paracentesis with fluid analysis and done and drained  2.9 Liters of golden fluid that was sent for Analysis - Maddrey discriminant function score is 39 on admission; viral hep panel pending, and if neg, may benefit from steroid   -Low Salt Diet Started -C/w Albumin/Lasix -Check Ammonia Level  2. Liver lesion  - 2.4 cm hypodense lesion noted in central liver on CT -MRI showed Segment 8 right liver lobe 1.8 cm liver lesion, considered LI-RADS category 3 (intermediate probability for [facility]). Follow-up MRI abdomen without and with IV contrast recommended in 3-6 months. -AFP pending  3. Hyponatremia - Serum sodium 131 on admission, and repeat was 131 - Secondary to cirrhosis with reduced effective arterial blood volume - Repeat chem panel in am   4. Hypokalemia  - Serum potassium is 3.4 on admission and was 3.3 this AM -Given KCl 40 mEQ BID;  -Repeat chem panel in am   5. Coagulapathy  - INR 1.40 on admission without any apparent bleeding and repeat was 1.41  - Likely  secondary to liver disease; will monitor   6. Anemia, thrombocytopenia  - Hgb is 8.4 on admission and platelets 127k; Dropped to 7.4 and 109  - There is no apparent bleeding, and no pallor  - Likely secondary to alcohol abuse and liver disease - Type and screen performed; anemia panel sent  - Anemia  Panel shows Iron Level of 30, TIBC of 246, Sat Ratio of 12, and Ferritin Level of 73 -Repeat CMP in AM  7. Anasarca  - Pt presents with anasarca secondary to decompensated cirrhosis  -Paracentesis as above  - Albumin only 2.4 and albumin will be given q6h with Lasix 20 mg IV q12h  - Follow daily wts and I/O's  - RLE much larger than left and venous US done and is Negative for DVT in the Right lower extremity and no Bakers Cyst  8. Mild Leukocytosis -WBC went from 12.6 -> 12.4 -No Source of Infection Identified; Fluid Analysis sent  -Will Check for SBP -Repeat CBC in AM  DVT prophylaxis: Lovenox 40 mg sq Code Status: FULL CODE Family Communication: Discussed with Children bedside Disposition Plan: Will get PT to Evaluate  Consultants:   Interventional Radiology   Procedures: Paracentesis  Antimicrobials: Anti-infectives    None       Subjective: Seen and examined and denied any abdominal Pain. States he is extremely swollen. No nausea or vomiting. No other complaints or concerns.   Objective: Vitals:   12/30/16 1319 12/30/16 1440 12/30/16 1445 12/30/16 1500  BP: (!) 130/112 (!) 154/99 (!) 146/91 (!) 141/93  Pulse: (!) 110     Resp: 20     Temp: 98.9 F (37.2 C)     TempSrc: Oral     SpO2: 97%     Weight:      Height:        Intake/Output Summary (Last 24 hours) at 12/30/16 1954 Last data filed at 12/30/16 1500  Gross per 24 hour  Intake              300 ml  Output                0 ml  Net              300 ml   Filed Weights   12/29/16 1844 12/29/16 2329 12/30/16 0629  Weight: 107.8 kg (237 lb 9.6 oz) 106.8 kg (235 lb 7.2 oz) 106.4 kg (234 lb 9.1 oz)   Examination: Physical Exam:  Constitutional: NAD patient with Jaundice Eyes: PERRL, lids and conjunctivae normal, sclerae icteric noted  ENMT: External Ears, Nose appear normal. Grossly normal hearing. Mucous membranes are moist.  Neck: Appears normal, supple, no cervical masses, normal ROM, no  appreciable thyromegaly Respiratory: Diminished to auscultation bilaterally, no wheezing, rales, rhonchi or crackles. Normal respiratory effort and patient is not tachypenic. No accessory muscle use.  Cardiovascular: RRR, no murmurs / rubs / gallops. S1 and S2 auscultated. 2+ Bilateral Edema with Right worse than left Abdomen: Soft, non-tender, distended with fluid wave shift. No masses palpated. Bowel sounds positive x4.  GU: Deferred. Musculoskeletal: No clubbing / cyanosis of digits/nails. No joint deformity upper and lower extremities. Good ROM, no contractures. Normal strength and muscle tone.  Skin: Yellowish hue of skin, No rashes, lesions or ulcers noted. Neurologic: CN 2-12 grossly intact with no focal deficits. Romberg sign cerebellar reflexes not assessed.  Psychiatric: Normal judgment and insight. Alert and oriented x 3. Normal mood and appropriate affect.   Data Reviewed:  I have personally reviewed following labs and imaging studies  CBC:  Recent Labs Lab 12/29/16 1940 12/30/16 0144  WBC 12.6* 12.4*  NEUTROABS 10.8* 11.2*  HGB 8.4* 7.4*  HCT 25.0* 21.8*  MCV 96.9 98.6  PLT 127* 694*   Basic Metabolic Panel:  Recent Labs Lab 12/29/16 1940 12/30/16 0144  NA 131* 131*  K 3.4* 3.3*  CL 97* 98*  CO2 23 21*  GLUCOSE 107* 105*  BUN 22* 22*  CREATININE 1.14 1.01  CALCIUM 8.1* 7.9*  MG  --  1.7   GFR: Estimated Creatinine Clearance: 104.9 mL/min (by C-G formula based on SCr of 1.01 mg/dL). Liver Function Tests:  Recent Labs Lab 12/29/16 1940 12/30/16 0144  AST 131* 129*  ALT 33 31  ALKPHOS 244* 224*  BILITOT 17.2* 16.8*  PROT 7.1 7.2  ALBUMIN 2.4* 2.5*   No results for input(s): LIPASE, AMYLASE in the last 168 hours. No results for input(s): AMMONIA in the last 168 hours. Coagulation Profile:  Recent Labs Lab 12/29/16 1940 12/30/16 0144  INR 1.40 1.41   Cardiac Enzymes:  Recent Labs Lab 12/29/16 1940  TROPONINI 0.03*   BNP (last 3  results) No results for input(s): PROBNP in the last 8760 hours. HbA1C: No results for input(s): HGBA1C in the last 72 hours. CBG: No results for input(s): GLUCAP in the last 168 hours. Lipid Profile: No results for input(s): CHOL, HDL, LDLCALC, TRIG, CHOLHDL, LDLDIRECT in the last 72 hours. Thyroid Function Tests: No results for input(s): TSH, T4TOTAL, FREET4, T3FREE, THYROIDAB in the last 72 hours. Anemia Panel:  Recent Labs  12/29/16 1955 12/29/16 2345  VITAMINB12  --  1,748*  FOLATE  --  14.6  FERRITIN  --  73  TIBC  --  246*  IRON  --  30*  RETICCTPCT 2.9  --    Sepsis Labs: No results for input(s): PROCALCITON, LATICACIDVEN in the last 168 hours.  No results found for this or any previous visit (from the past 240 hour(s)).   Radiology Studies: Dg Chest 2 View  Result Date: 12/29/2016 CLINICAL DATA:  Shortness of breath, fatigue, and lower extremity swelling for 2 weeks. Jaundice. EXAM: CHEST  2 VIEW COMPARISON:  None. FINDINGS: The heart size and mediastinal contours are within normal limits. Low lung volumes noted. Linear opacities in both lung bases may be due to atelectasis or scarring. No evidence of pulmonary consolidation or edema. No evidence of pneumothorax or pleural effusion. IMPRESSION: Low lung volumes, with bibasilar atelectasis versus scarring. Electronically Signed   By: Earle Gell M.D.   On: 12/29/2016 20:04   Ct Abdomen Pelvis W Contrast  Result Date: 12/29/2016 CLINICAL DATA:  Initial evaluation for acute generalized weakness with fatigue. Evaluate for cirrhosis. EXAM: CT ABDOMEN AND PELVIS WITH CONTRAST TECHNIQUE: Multidetector CT imaging of the abdomen and pelvis was performed using the standard protocol following bolus administration of intravenous contrast. CONTRAST:  186m ISOVUE-300 IOPAMIDOL (ISOVUE-300) INJECTION 61% COMPARISON:  None available. FINDINGS: Lower chest: Scattered patchy and linear atelectatic changes present within the visualized lung  bases. Visualized lung bases are otherwise clear. Extensive 3 vessel coronary artery calcifications noted. Hepatobiliary: The liver is heterogeneous in appearance with a nodular contour and caudate lobe hypertrophy, compatible with cirrhosis. Superimposed 2.4 cm hypodense lesion noted within the central aspect of the liver (series 2, image 28), indeterminate. No other definite focal lesions identified. Small granuloma noted. Evidence of portal hypertension with the recannulized paraumbilical vein. Large varix noted within the left abdomen  as well. Few scattered smaller varices present within the upper abdomen. Trace hyperdensity within the gallbladder lumen, likely small stone. No biliary dilatation. Pancreas: Pancreas within normal limits. Spleen: Spleen is enlarged measuring 15.2 cm in craniocaudad dimension. Adrenals/Urinary Tract: Adrenal glands are normal. Kidneys equal in size with symmetric enhancement. No nephrolithiasis, hydronephrosis, or focal enhancing renal mass. No hydroureter. Bladder within normal limits. Stomach/Bowel: Stomach within normal limits. No evidence for bowel obstruction. Scattered hazy stranding and fluid about the small and large bowel felt to be related to underlying intrinsic liver disease. Colonic diverticulosis without definite evidence for acute diverticulitis. Appendix is normal. No definite acute inflammatory changes about the bowels. Vascular/Lymphatic: Extensive aorto bi-iliac atherosclerotic disease. No aneurysm. No pathologically enlarged intra-abdominal or pelvic lymph nodes. Reproductive: Prostate somewhat enlarged with median lobe hypertrophy, invaginating upon the base of the bladder. Other: No free intraperitoneal air. Moderate volume ascites within the abdomen and pelvis. Small amount of fluid extends through a small paraumbilical hernia. Musculoskeletal:  Anasarca noted. No acute osseous abnormality. No worrisome lytic or blastic osseous lesions. Degenerative  spondylolysis noted within the lumbar spine, greatest at L5-S1. IMPRESSION: 1. Cirrhosis with moderate volume ascites and evidence of portal hypertension as detailed above. 2. 2.4 cm hypodense lesion within the central aspect of the liver, indeterminate. Given the underlying intrinsic liver disease, further evaluation with dedicated MRI is recommended. 3. Colonic diverticulosis without definite evidence for acute diverticulitis. 4. Cholelithiasis. 5. Extensive coronary artery calcifications with moderate aorto bi-iliac atherosclerotic disease. Electronically Signed   By: Jeannine Boga M.D.   On: 12/29/2016 21:34   Mr Liver W SE Contrast  Result Date: 12/30/2016 CLINICAL DATA:  Inpatient. Alcoholic cirrhosis with ascites. Indeterminate liver lesion on CT study performed 1 day prior. EXAM: MRI ABDOMEN WITHOUT AND WITH CONTRAST TECHNIQUE: Multiplanar multisequence MR imaging of the abdomen was performed both before and after the administration of intravenous contrast. CONTRAST:  10 cc Eovist IV. COMPARISON:  12/29/2016 CT abdomen/ pelvis FINDINGS: Motion degraded scan. Lower chest: Stable hypoventilatory changes versus scarring at the right greater than left lung bases. Hepatobiliary: Hepatomegaly. Diffusely mildly irregular liver surface, compatible with cirrhosis. Heterogeneous moderate diffuse hepatic steatosis. There is a 1.8 x 1.4 cm focus of abnormal signal intensity in the segment 8 right liver lobe (series 504/ image 44), which demonstrates loss of signal intensity on out of phase chemical shift imaging indicating internal fat, which is occult on the diffusion, T2 weighted, precontrast T1 and hepatobiliary phase postcontrast sequences, with no arterial phase hyperenhancement, and with questionable washout on the portal venous phase sequences, considered LI-RADS category 3. No additional liver lesions. Normal gallbladder with no cholelithiasis. No biliary ductal dilatation. Common bile duct diameter 3  mm. No choledocholithiasis. Pancreas: No pancreatic mass or duct dilation. No evidence of pancreas divisum. Spleen: Mild splenomegaly (craniocaudal splenic length 15.9 cm). No splenic mass. Adrenals/Urinary Tract: Normal adrenals. No hydronephrosis. Normal kidneys with no renal mass. Stomach/Bowel: Grossly normal stomach. Visualized small and large bowel is normal caliber, with no bowel wall thickening. Vascular/Lymphatic: Normal caliber abdominal aorta. Patent portal, splenic, hepatic and renal veins. Small paraumbilical varices. No pathologically enlarged lymph nodes in the abdomen. Other: Small to moderate volume abdominal ascites. No focal fluid collection. Musculoskeletal: No aggressive appearing focal osseous lesions. IMPRESSION: 1. Cirrhosis.  Heterogeneous diffuse hepatic steatosis. 2. Segment 8 right liver lobe 1.8 cm liver lesion, considered LI-RADS category 3 (intermediate probability for [facility]). Follow-up MRI abdomen without and with IV contrast recommended in 3-6 months. 3. Mild  splenomegaly. Small to moderate volume abdominal ascites. Small paraumbilical varices. Electronically Signed   By: Ilona Sorrel M.D.   On: 12/30/2016 12:42   US Paracentesis  Result Date: 12/30/2016 INDICATION: Jaundice, elevated liver function tests, cirrhosis, liver lesion, ascites. Request made for diagnostic and therapeutic paracentesis. EXAM: ULTRASOUND GUIDED DIAGNOSTIC AND THERAPEUTIC PARACENTESIS MEDICATIONS: None. COMPLICATIONS: None immediate. PROCEDURE: Informed written consent was obtained from the patient after a discussion of the risks, benefits and alternatives to treatment. A timeout was performed prior to the initiation of the procedure. Initial ultrasound scanning demonstrates a small to moderate amount of ascites within the right lower abdominal quadrant. The right lower abdomen was prepped and draped in the usual sterile fashion. 1% lidocaine was used for local anesthesia. Following this, a Yueh catheter  was introduced. An ultrasound image was saved for documentation purposes. The paracentesis was performed. The catheter was removed and a dressing was applied. The patient tolerated the procedure well without immediate post procedural complication. FINDINGS: A total of approximately 2.9 liters of golden yellow fluid was removed. Samples were sent to the laboratory as requested by the clinical team. IMPRESSION: Successful ultrasound-guided diagnostic and therapeutic paracentesis yielding 2.9 liters of peritoneal fluid. Read by: Rowe Robert, PA-C Electronically Signed   By: Jerilynn Mages.  Shick M.D.   On: 12/30/2016 16:18   Scheduled Meds: . enoxaparin (LOVENOX) injection  40 mg Subcutaneous QHS  . famotidine  20 mg Oral BID  . folic acid  1 mg Oral Daily  . furosemide  20 mg Intravenous Q12H  . LORazepam  0-4 mg Oral Q6H   Followed by  . [START ON 01/01/2017] LORazepam  0-4 mg Oral Q12H  . multivitamin with minerals  1 tablet Oral Daily  . potassium chloride  40 mEq Oral BID  . sodium chloride flush  3 mL Intravenous Q12H  . thiamine  100 mg Oral Daily   Or  . thiamine  100 mg Intravenous Daily   Continuous Infusions:   LOS: 1 day   Kerney Elbe, DO [facility] Pager [phone]  If 7PM-7AM, please contact night-coverage [email or web address] Password [facility] 12/30/2016, 7:54 PM

## 2016-12-30 NOTE — Evaluation (Signed)
SLP Cancellation Note  Patient Details Name: Tytan Sandate MRN: [id number] DOB: [birth year]   Cancelled treatment:       Reason Eval/Treat Not Completed: Other (comment);Patient at procedure or test/unavailable (pt at paracentesis currently, RN reports pt able to swallow ativan well with no difficulties, will follow up)   Donavan Burnet, MS [facility] SLP [phone]

## 2016-12-31 DIAGNOSIS — E7220 Disorder of urea cycle metabolism, unspecified: Secondary | ICD-10-CM

## 2016-12-31 DIAGNOSIS — D72829 Elevated white blood cell count, unspecified: Secondary | ICD-10-CM

## 2016-12-31 LAB — COMPREHENSIVE METABOLIC PANEL
ALT: 33 U/L (ref 17–63)
AST: 125 U/L — ABNORMAL HIGH (ref 15–41)
Albumin: 2.9 g/dL — ABNORMAL LOW (ref 3.5–5.0)
Alkaline Phosphatase: 218 U/L — ABNORMAL HIGH (ref 38–126)
Anion gap: 12 (ref 5–15)
BILIRUBIN TOTAL: 18 mg/dL — AB (ref 0.3–1.2)
BUN: 18 mg/dL (ref 6–20)
CHLORIDE: 100 mmol/L — AB (ref 101–111)
CO2: 23 mmol/L (ref 22–32)
CREATININE: 0.85 mg/dL (ref 0.61–1.24)
Calcium: 8.4 mg/dL — ABNORMAL LOW (ref 8.9–10.3)
Glucose, Bld: 94 mg/dL (ref 65–99)
Potassium: 3.4 mmol/L — ABNORMAL LOW (ref 3.5–5.1)
Sodium: 135 mmol/L (ref 135–145)
TOTAL PROTEIN: 7.2 g/dL (ref 6.5–8.1)

## 2016-12-31 LAB — CBC WITH DIFFERENTIAL/PLATELET
Basophils Absolute: 0 10*3/uL (ref 0.0–0.1)
Basophils Relative: 0 %
EOS PCT: 2 %
Eosinophils Absolute: 0.3 10*3/uL (ref 0.0–0.7)
HEMATOCRIT: 24.3 % — AB (ref 39.0–52.0)
Hemoglobin: 8 g/dL — ABNORMAL LOW (ref 13.0–17.0)
LYMPHS ABS: 0.7 10*3/uL (ref 0.7–4.0)
Lymphocytes Relative: 5 %
MCH: 32.3 pg (ref 26.0–34.0)
MCHC: 32.9 g/dL (ref 30.0–36.0)
MCV: 98 fL (ref 78.0–100.0)
MONO ABS: 1.1 10*3/uL — AB (ref 0.1–1.0)
MONOS PCT: 8 %
NEUTROS ABS: 11.5 10*3/uL — AB (ref 1.7–7.7)
Neutrophils Relative %: 85 %
PLATELETS: 116 10*3/uL — AB (ref 150–400)
RBC: 2.48 MIL/uL — ABNORMAL LOW (ref 4.22–5.81)
RDW: 17.8 % — AB (ref 11.5–15.5)
WBC: 13.6 10*3/uL — ABNORMAL HIGH (ref 4.0–10.5)

## 2016-12-31 LAB — HEPATITIS PANEL, ACUTE
HCV AB: 0.1 {s_co_ratio} (ref 0.0–0.9)
HEP A IGM: NEGATIVE
HEP B C IGM: NEGATIVE
HEP B S AG: NEGATIVE

## 2016-12-31 LAB — MAGNESIUM: MAGNESIUM: 1.5 mg/dL — AB (ref 1.7–2.4)

## 2016-12-31 LAB — AFP TUMOR MARKER: AFP-Tumor Marker: 3.2 ng/mL (ref 0.0–8.3)

## 2016-12-31 LAB — PHOSPHORUS: PHOSPHORUS: 3 mg/dL (ref 2.5–4.6)

## 2016-12-31 LAB — PATHOLOGIST SMEAR REVIEW

## 2016-12-31 LAB — GLUCOSE, CAPILLARY: Glucose-Capillary: 92 mg/dL (ref 65–99)

## 2016-12-31 LAB — AMMONIA: Ammonia: 57 umol/L — ABNORMAL HIGH (ref 9–35)

## 2016-12-31 MED ORDER — MAGNESIUM SULFATE 2 GM/50ML IV SOLN
2.0000 g | Freq: Once | INTRAVENOUS | Status: AC
  Administered 2016-12-31: 2 g via INTRAVENOUS
  Filled 2016-12-31: qty 50

## 2016-12-31 MED ORDER — LACTULOSE 10 GM/15ML PO SOLN
20.0000 g | Freq: Two times a day (BID) | ORAL | Status: DC
  Administered 2016-12-31 – 2017-01-01 (×3): 20 g via ORAL
  Filled 2016-12-31 (×3): qty 30

## 2016-12-31 MED ORDER — DEXTROSE 5 % IV SOLN
2.0000 g | INTRAVENOUS | Status: DC
  Administered 2016-12-31: 2 g via INTRAVENOUS
  Filled 2016-12-31: qty 2

## 2016-12-31 NOTE — Progress Notes (Signed)
CSW met with pt to offer resources initally pt refused but as session progressed pt accepted  meeting schedules for area Alcoholics Anonymous and Narcotics Anonymous 12-Step meetings as well as inpatient/outpatient SA Tx resources.  CSW provided education to the pt as to the efficacy of 12-step programs for community support for those needing support in addition to or other than inpatient/outpatient treatment.  Pt appreciated CSW's efforts and thanked the CSW.  Alphonse Guild. Verlinda Slotnick, Latanya Presser, LCAS Clinical Social Worker Ph: [phone]

## 2016-12-31 NOTE — Evaluation (Addendum)
Clinical/Bedside Swallow Evaluation Patient Details  Name: Richard Bird MRN: [id number] Date of Birth: 27-Jun-1966  Today's Date: 12/31/2016 Time: SLP Start Time (ACUTE ONLY): 1350 SLP Stop Time (ACUTE ONLY): 1410 SLP Time Calculation (min) (ACUTE ONLY): 20 min  Past Medical History: History reviewed. No pertinent past medical history. Past Surgical History:  Past Surgical History:  Procedure Laterality Date  . HERNIA REPAIR    . KNEE SURGERY     HPI:  51 yo male adm to [facility] with jaundiced, ETOH use, pill dysphagia and is s/p paracentesis yesterday.  Pt reports large pills make him gag when trying to swallow them.  Pt reportedly has problems swallowing pills.  He admits small pills are easier but large pills difficult.   RN reports pt masticating some pills.     Assessment / Plan / Recommendation Clinical Impression  Pt presents with functional *albeit slow* oropharyngeal swallow ability.  No indication of aspiration/penetration with po observed including water and solids.  Swallow was timely and appeared strong without signs of residuals. Pt admits to problems swallowing large pills - SLP advised him to alernative ways to take medications for easier swallowing.    Given h/o GERD, ? if if this could be source of large pill dysphagia.  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    Offered GERD precautions which pt politely declined, all education completed and no follow up indicated.    SLP Visit Diagnosis: Dysphagia, unspecified (R13.10)    Aspiration Risk  Mild aspiration risk    Diet Recommendation Regular;Thin liquid   Liquid Administration via: Cup;Straw Medication Administration:  (large with puree) Supervision: Patient able to self feed Compensations: Slow rate;Small sips/bites;Minimize environmental distractions Postural Changes: Seated upright at 90 degrees;Remain upright for at least 30 minutes after po intake    Other  Recommendations Oral Care Recommendations:  Oral care BID   Follow up Recommendations None      Frequency and Duration            Prognosis        Swallow Study   General Date of Onset: 12/31/16 HPI: 51 yo male adm to [facility] with jaundiced, ETOH use, pill dysphagia and is s/p paracentesis.  Pt reportedly has problems swallowing pills.  He admits small pills are easier but large pills difficult.   RN reports pt masticating some pills.   Type of Study: Bedside Swallow Evaluation Diet Prior to this Study: Regular;Thin liquids Temperature Spikes Noted: Yes (low grade 99.1) Respiratory Status: Room air History of Recent Intubation: No Behavior/Cognition: Alert;Cooperative;Confused Oral Cavity Assessment: Other (comment);Lesions (? whitish lesion on tongue, pt reports this to be normal when he examined tongue in mirror) Oral Care Completed by SLP: No Oral Cavity - Dentition: Adequate natural dentition Vision: Functional for self-feeding Self-Feeding Abilities: Other (Comment) (pt with some impaired proprioception, trying to get food/straw to mouth, with time he is successful) Patient Positioning: Upright in bed Baseline Vocal Quality: Normal Volitional Cough: Strong Volitional Swallow: Able to elicit    Oral/Motor/Sensory Function Overall Oral Motor/Sensory Function: Within functional limits   Ice Chips Ice chips: Not tested   Thin Liquid Thin Liquid: Within functional limits Presentation: Straw;Self Fed;Cup    Nectar Thick Nectar Thick Liquid: Not tested   Honey Thick Honey Thick Liquid: Not tested   Puree Puree: Not tested   Solid   GO   Solid: Within functional limits Presentation: Self Lisabeth Pick, MS [facility] SLP [phone]

## 2016-12-31 NOTE — Evaluation (Signed)
SLP Cancellation Note  Patient Details Name: Richard Bird MRN: [id number] DOB: 29-Apr-1966   Cancelled treatment:       Reason Eval/Treat Not Completed:  (pt currently asleep, son [name] present and reports pt has had pill dysphagia his entire life)   Mills Koller, [facility] SLP [phone]

## 2016-12-31 NOTE — Consult Note (Signed)
Referring Provider:  Dr. Brien Few Primary Care Physician:  Juluis Mire, MD Primary Gastroenterologist:  Gentry Fitz  Reason for Consultation:  Decompensated cirrhosis  HPI: Richard Bird is a 51 y.o. male came into the hospital on 12/29/2016 for further evaluation of generalized weakness and fatigue , and jaundice . Upon Initial evaluation , patient was found to have abnormal LFTs, anemia with hemoglobin of 8.4 as well as leukocytosis. Bilirubin of 17.2. Underwent CT scan which showed cirrhosis and questionable liver lesion. Follow-up MRI showed indeterminate lesion in segment 8. Repeat MRI with IV contrast recommended in 3-6 months. Patient also underwent paracentesis yesterday with removal of 2.9 L of fluid. Fluid analysis showed only 91 WBCs. Negative for SBP. Fluid culture preliminary report also negative. GI is consulted for further evaluation of decompensated cirrhosis.  Patient seen and examined. Patient started noticing dark urine and yellow color skin around 3-4 weeks ago. Patient subsequently started feeling weak and fatigued. Patient also complaining of abdominal distention as well as lower Ext  swelling. Last use of alcohol was around 1 week ago. Denied any rectal bleeding. Denied confusion.  No previous GI workup.   PMH : - Alcohol use disorder  Past Surgical History:  Procedure Laterality Date  . HERNIA REPAIR    . KNEE SURGERY      Prior to Admission medications   Medication Sig Start Date End Date Taking? Authorizing Provider  Naproxen Sod-Diphenhydramine (ALEVE PM) 220-25 MG TABS Take 1 tablet by mouth at bedtime as needed (sleep).   Yes Historical Provider, MD    Scheduled Meds: . cefTRIAXone (ROCEPHIN)  IV  2 g Intravenous Q24H  . enoxaparin (LOVENOX) injection  40 mg Subcutaneous QHS  . famotidine  20 mg Oral BID  . folic acid  1 mg Oral Daily  . furosemide  20 mg Intravenous Q12H  . lactulose  20 g Oral BID  . LORazepam  0-4 mg Oral Q6H   Followed by   . [START ON 01/01/2017] LORazepam  0-4 mg Oral Q12H  . multivitamin with minerals  1 tablet Oral Daily  . potassium chloride  40 mEq Oral BID  . sodium chloride flush  3 mL Intravenous Q12H  . thiamine  100 mg Oral Daily   Or  . thiamine  100 mg Intravenous Daily   Continuous Infusions: PRN Meds:.acetaminophen **OR** acetaminophen, albumin human, bisacodyl, LORazepam **OR** LORazepam, ondansetron **OR** ondansetron (ZOFRAN) IV, oxyCODONE, polyethylene glycol  Allergies as of 12/29/2016 - Review Complete 12/29/2016  Allergen Reaction Noted  . Penicillins  12/29/2016    History reviewed. No pertinent family history.  Social History   Social History  . Marital status: Married    Spouse name: N/A  . Number of children: N/A  . Years of education: N/A   Occupational History  . Not on file.   Social History Main Topics  . Smoking status: Never Smoker  . Smokeless tobacco: Never Used  . Alcohol use 25.2 oz/week    42 Shots of liquor per week  . Drug use: No  . Sexual activity: Not on file   Other Topics Concern  . Not on file   Social History Narrative  . No narrative on file    Review of Systems:  Review of Systems  Constitutional: Positive for malaise/fatigue. Negative for chills and fever.  HENT: Negative for hearing loss and tinnitus.   Eyes: Negative for double vision and photophobia.  Respiratory: Negative for hemoptysis, sputum production and wheezing.   Cardiovascular: Positive for leg  swelling. Negative for chest pain and palpitations.  Gastrointestinal: Positive for abdominal pain, heartburn and nausea.  Genitourinary: Negative for dysuria and urgency.  Musculoskeletal: Positive for myalgias.  Skin: Positive for itching. Negative for rash.  Neurological: Positive for dizziness, tremors and weakness. Negative for focal weakness and seizures.  Endo/Heme/Allergies: Bruises/bleeds easily.  Psychiatric/Behavioral: Positive for substance abuse. Negative for  hallucinations and suicidal ideas.    Physical Exam: Vital signs: Vitals:   12/31/16 0015 12/31/16 0634  BP: (!) 175/94 (!) 155/97  Pulse: (!) 116 (!) 104  Resp: 20 18  Temp: 99.7 F (37.6 C) 99.1 F (37.3 C)   Last BM Date: 12/30/16  Physical Exam  Constitutional: He is oriented to person, place, and time.  Appears jaundiced. Alert and oriented, cooperative.  HENT:  Head: Normocephalic and atraumatic.  Mouth/Throat: No oropharyngeal exudate.  Eyes: EOM are normal. Scleral icterus is present.  Neck: Normal range of motion. Neck supple.  Cardiovascular: Normal rate and regular rhythm.   Murmur heard. Pulmonary/Chest: Effort normal.  Findings bibasilar crackles noted  Abdominal: Bowel sounds are normal. He exhibits distension. There is no tenderness. There is no rebound.  Abdomen remains distended. No tenderness. Bowel sounds present. No peritoneal signs.  Musculoskeletal: He exhibits edema.  2+ lower extremity edema  Neurological: He is alert and oriented to person, place, and time.  Skin:  Palmer erythema and spider angioma noted  Psychiatric: Mood normal.  Flat affect  Vitals reviewed.   GI:  Lab Results:  Recent Labs  12/29/16 1940 12/30/16 0144 12/31/16 0600  WBC 12.6* 12.4* 13.6*  HGB 8.4* 7.4* 8.0*  HCT 25.0* 21.8* 24.3*  PLT 127* 109* 116*   BMET  Recent Labs  12/29/16 1940 12/30/16 0144 12/31/16 0600  NA 131* 131* 135  K 3.4* 3.3* 3.4*  CL 97* 98* 100*  CO2 23 21* 23  GLUCOSE 107* 105* 94  BUN 22* 22* 18  CREATININE 1.14 1.01 0.85  CALCIUM 8.1* 7.9* 8.4*   LFT  Recent Labs  12/29/16 1940  12/31/16 0600  PROT 7.1  < > 7.2  ALBUMIN 2.4*  < > 2.9*  AST 131*  < > 125*  ALT 33  < > 33  ALKPHOS 244*  < > 218*  BILITOT 17.2*  < > 18.0*  BILIDIR 9.3*  --   --   < > = values in this interval not displayed. PT/INR  Recent Labs  12/29/16 1940 12/30/16 0144  LABPROT 17.3* 17.3*  INR 1.40 1.41     Studies/Results: Dg Chest 2  View  Result Date: 12/29/2016 CLINICAL DATA:  Shortness of breath, fatigue, and lower extremity swelling for 2 weeks. Jaundice. EXAM: CHEST  2 VIEW COMPARISON:  None. FINDINGS: The heart size and mediastinal contours are within normal limits. Low lung volumes noted. Linear opacities in both lung bases may be due to atelectasis or scarring. No evidence of pulmonary consolidation or edema. No evidence of pneumothorax or pleural effusion. IMPRESSION: Low lung volumes, with bibasilar atelectasis versus scarring. Electronically Signed   By: Myles Rosenthal M.D.   On: 12/29/2016 20:04   Ct Abdomen Pelvis W Contrast  Result Date: 12/29/2016 CLINICAL DATA:  Initial evaluation for acute generalized weakness with fatigue. Evaluate for cirrhosis. EXAM: CT ABDOMEN AND PELVIS WITH CONTRAST TECHNIQUE: Multidetector CT imaging of the abdomen and pelvis was performed using the standard protocol following bolus administration of intravenous contrast. CONTRAST:  ISOVUE-300 IOPAMIDOL (ISOVUE-300) INJECTION 61% COMPARISON:  None available. FINDINGS: Lower chest:  Scattered patchy and linear atelectatic changes present within the visualized lung bases. Visualized lung bases are otherwise clear. Extensive 3 vessel coronary artery calcifications noted. Hepatobiliary: The liver is heterogeneous in appearance with a nodular contour and caudate lobe hypertrophy, compatible with cirrhosis. Superimposed 2.4 cm hypodense lesion noted within the central aspect of the liver (series 2, image 28), indeterminate. No other definite focal lesions identified. Small granuloma noted. Evidence of portal hypertension with the recannulized paraumbilical vein. Large varix noted within the left abdomen as well. Few scattered smaller varices present within the upper abdomen. Trace hyperdensity within the gallbladder lumen, likely small stone. No biliary dilatation. Pancreas: Pancreas within normal limits. Spleen: Spleen is enlarged measuring 15.2 cm in  craniocaudad dimension. Adrenals/Urinary Tract: Adrenal glands are normal. Kidneys equal in size with symmetric enhancement. No nephrolithiasis, hydronephrosis, or focal enhancing renal mass. No hydroureter. Bladder within normal limits. Stomach/Bowel: Stomach within normal limits. No evidence for bowel obstruction. Scattered hazy stranding and fluid about the small and large bowel felt to be related to underlying intrinsic liver disease. Colonic diverticulosis without definite evidence for acute diverticulitis. Appendix is normal. No definite acute inflammatory changes about the bowels. Vascular/Lymphatic: Extensive aorto bi-iliac atherosclerotic disease. No aneurysm. No pathologically enlarged intra-abdominal or pelvic lymph nodes. Reproductive: Prostate somewhat enlarged with median lobe hypertrophy, invaginating upon the base of the bladder. Other: No free intraperitoneal air. Moderate volume ascites within the abdomen and pelvis. Small amount of fluid extends through a small paraumbilical hernia. Musculoskeletal:  Anasarca noted. No acute osseous abnormality. No worrisome lytic or blastic osseous lesions. Degenerative spondylolysis noted within the lumbar spine, greatest at L5-S1. IMPRESSION: 1. Cirrhosis with moderate volume ascites and evidence of portal hypertension as detailed above. 2. 2.4 cm hypodense lesion within the central aspect of the liver, indeterminate. Given the underlying intrinsic liver disease, further evaluation with dedicated MRI is recommended. 3. Colonic diverticulosis without definite evidence for acute diverticulitis. 4. Cholelithiasis. 5. Extensive coronary artery calcifications with moderate aorto bi-iliac atherosclerotic disease. Electronically Signed   By: Rise Mu M.D.   On: 12/29/2016 21:34   Mr Liver W ZO Contrast  Result Date: 12/30/2016 CLINICAL DATA:  Inpatient. Alcoholic cirrhosis with ascites. Indeterminate liver lesion on CT study performed 1 day prior.  EXAM: MRI ABDOMEN WITHOUT AND WITH CONTRAST TECHNIQUE: Multiplanar multisequence MR imaging of the abdomen was performed both before and after the administration of intravenous contrast. CONTRAST:  10 cc Eovist IV. COMPARISON:  12/29/2016 CT abdomen/ pelvis FINDINGS: Motion degraded scan. Lower chest: Stable hypoventilatory changes versus scarring at the right greater than left lung bases. Hepatobiliary: Hepatomegaly. Diffusely mildly irregular liver surface, compatible with cirrhosis. Heterogeneous moderate diffuse hepatic steatosis. There is a 1.8 x 1.4 cm focus of abnormal signal intensity in the segment 8 right liver lobe (series 504/ image 44), which demonstrates loss of signal intensity on out of phase chemical shift imaging indicating internal fat, which is occult on the diffusion, T2 weighted, precontrast T1 and hepatobiliary phase postcontrast sequences, with no arterial phase hyperenhancement, and with questionable washout on the portal venous phase sequences, considered LI-RADS category 3. No additional liver lesions. Normal gallbladder with no cholelithiasis. No biliary ductal dilatation. Common bile duct diameter 3 mm. No choledocholithiasis. Pancreas: No pancreatic mass or duct dilation. No evidence of pancreas divisum. Spleen: Mild splenomegaly (craniocaudal splenic length 15.9 cm). No splenic mass. Adrenals/Urinary Tract: Normal adrenals. No hydronephrosis. Normal kidneys with no renal mass. Stomach/Bowel: Grossly normal stomach. Visualized small and large bowel is normal caliber,  with no bowel wall thickening. Vascular/Lymphatic: Normal caliber abdominal aorta. Patent portal, splenic, hepatic and renal veins. Small paraumbilical varices. No pathologically enlarged lymph nodes in the abdomen. Other: Small to moderate volume abdominal ascites. No focal fluid collection. Musculoskeletal: No aggressive appearing focal osseous lesions. IMPRESSION: 1. Cirrhosis.  Heterogeneous diffuse hepatic steatosis.  2. Segment 8 right liver lobe 1.8 cm liver lesion, considered LI-RADS category 3 (intermediate probability for [facility]). Follow-up MRI abdomen without and with IV contrast recommended in 3-6 months. 3. Mild splenomegaly. Small to moderate volume abdominal ascites. Small paraumbilical varices. Electronically Signed   By: Delbert Phenix M.D.   On: 12/30/2016 12:42   US Paracentesis  Result Date: 12/30/2016 INDICATION: Jaundice, elevated liver function tests, cirrhosis, liver lesion, ascites. Request made for diagnostic and therapeutic paracentesis. EXAM: ULTRASOUND GUIDED DIAGNOSTIC AND THERAPEUTIC PARACENTESIS MEDICATIONS: None. COMPLICATIONS: None immediate. PROCEDURE: Informed written consent was obtained from the patient after a discussion of the risks, benefits and alternatives to treatment. A timeout was performed prior to the initiation of the procedure. Initial ultrasound scanning demonstrates a small to moderate amount of ascites within the right lower abdominal quadrant. The right lower abdomen was prepped and draped in the usual sterile fashion. 1% lidocaine was used for local anesthesia. Following this, a Yueh catheter was introduced. An ultrasound image was saved for documentation purposes. The paracentesis was performed. The catheter was removed and a dressing was applied. The patient tolerated the procedure well without immediate post procedural complication. FINDINGS: A total of approximately 2.9 liters of golden yellow fluid was removed. Samples were sent to the laboratory as requested by the clinical team. IMPRESSION: Successful ultrasound-guided diagnostic and therapeutic paracentesis yielding 2.9 liters of peritoneal fluid. Read by: Jeananne Rama, PA-C Electronically Signed   By: Judie Petit.  Shick M.D.   On: 12/30/2016 16:18    Impression/Plan: - Abnormal LFTs. 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 MELD score 25 and discriminant function score of 26.5 based on  yesterday's blood work. No PT/INR today. - Ascites. Status post paracentesis yesterday. Negative for SBP. - 1.8 cm segment 8 liver lesion. Indeterminate for HCC. Needs repeat MRI with IV contrast in 3-6 months. - Anemia. No overt bleeding. Discussed with the nursing staff. Last bowel movement yesterday. - Alcohol use disorder. Last drink a 1 week ago  Recommendations ------------------------- -  MELD score 25 and discriminant function score of 26.5 based on yesterday's blood work. No PT/INR today. - Recheck PT/INR tomorrow to recalculate discriminate function score for tomorrow. Status post paracentesis. Negative for SBP. Urinalysis negative for any infection. We'll get blood cultures. - Recommend starting prednisolone if discriminant function more than 32. - Absolute alcohol abstinence advised. Not a candidate for liver transplant because of recent alcohol use. - Prognosis guarded. GI will follow.    LOS: 2 days   Kathi Der  MD, FACP 12/31/2016, 12:39 PM  Pager [phone] If no answer or after 5 PM call [phone]

## 2016-12-31 NOTE — Progress Notes (Addendum)
PROGRESS NOTE    Beldon Nowling  ATF:[id number] DOB: 11-30-1965 DOA: 12/29/2016 PCP: Chevis Pretty, MD  Brief Narrative:  Eythan Jayne is a 51 y.o. male with medical history significant for long-standing alcohol dependence who presents the emergency department for evaluation of general weakness, shortness of breath, jaundice, abdominal distention, and bilateral lower extremity edema. Patient reports a long history of daily alcohol use, approximately 5-10 drinks per day and had otherwise been in his usual state of health until approximately 3 weeks ago when he noted the insidious development of right lower extremity swelling. Over the next week or so, he developed swelling in the left leg as well and also noted progressive abdominal distention and yellowing of his skin and eyes. Over the ensuing days, all of these complaints were progressively worsening and the patient was becoming increasingly short of breath with exertion. He denies any fevers or chills, denies chest pain or palpitations, and denies dyspnea while at rest, or cough. There has been no significant nausea, no vomiting, no diarrhea, and no melena or hematochezia. Patient denies any use of illicit substances and denies any history of IV drug abuse. He denies a family history of liver disease. He reports abstaining from alcohol for up to a couple months at a time remotely, but has not gone a day without drinking in many months. He denies history of DTs or seizure, but has noted mild withdrawal symptoms in the past which she has relieved with drinking. Admitted for Decompensated Cirrhosis and found to have a liver Lesion. Gastroenterology was consulter for further evaluation and recommendations for Liver Cirrhosis.   Assessment & Plan:   Principal Problem:   Cirrhosis of liver with ascites (HCC) Active Problems:   Alcoholic cirrhosis of liver with ascites (HCC)   Hyponatremia   Hypokalemia   LFTs abnormal   Coagulopathy (HCC)  Normocytic anemia   Hepatitis, alcoholic, acute   Liver lesion  1. Alcoholic hepatitis/Decompensated cirrhosis  - Pt with long hx of heavy daily alcohol use, now p/w jaundice, ascites, anasarca - Noted to have t bili 17 with alk phos 244, AST 131, and ALT 33, INR 1.40 -Repet TBili now (18.0) along with Alk Phos (218) and AST (125) - CT abd/pelvis reveals cirrhotic liver, evidence for portal HTN, and central hypodense liver lesion - Pt was counseled regarding critical importance of abstinence from alcohol going forward; family at bedside are very supportive - Plan to start acid-suppression, replete lytes, supplement vitamins and minerals, monitor with CIWA and prn Ativan - US-guided paracentesis with fluid analysis and done and drained  2.9 Liters of golden fluid that was sent for Analysis -Initial Concern for SBP but WBC was 91 so will Stop Abx - Maddrey discriminant function score is 39 on admission and was 26.5 yesterday's blood work -MELD Score 25 -Will need to Recheck PT/INR for Discriminant Function Score tomorrow  -Viral Hepatitis panel Negative -May benefit from steroid (Prednisolone per GI if Discriminant Function Score is more than 32 tomorrow) -Low Salt Diet Started -C/w Albumin 25 g IV q6h and IV Lasix 20 mg IV q12h -Checked Ammonia Level and was 57; will start Lactulose 20 grams po BID and repeat Ammonia Level in AM -Gastroenterology Consulted for further evaluation of decompensate Liver Cirrhosis and appreciate Recc's  2. Liver lesion  - 2.4 cm hypodense lesion noted in central liver on CT -MRI showed Segment 8 right liver lobe 1.8 cm liver lesion, considered LI-RADS category 3 (intermediate probability for [facility]). Follow-up MRI abdomen without and  with IV contrast recommended in 3-6 months. -AFP 3.2  3. Hyponatremia, improved - Serum sodium 131 on admission, and repeat was 135 - Secondary to cirrhosis with reduced effective arterial blood volume - Repeat chem panel in AM     4. Hypokalemia  - Serum potassium is 3.4 on admission and was 3.4 this AM -Given KCl 40 mEQ BID;  -Repeat CMP  5. Coagulapathy  - INR 1.40 on admission without any apparent bleeding and repeat was 1.41; No INR Done today but will repeat in AM - Likely secondary to liver disease; will monitor   6. Anemia,Thrombocytopenia  - Hgb is 8.4 on admission and platelets 127k; Now 8.0 and 116  - There is no apparent bleeding, and no pallor  - Likely secondary to alcohol abuse and liver disease - Type and screen performed; anemia panel sent  - Anemia Panel shows Iron Level of 30, TIBC of 246, Sat Ratio of 12, and Ferritin Level of 73 -Repeat CMP in AM  7. Anasarca  - Pt presents with anasarca secondary to decompensated cirrhosis  -Paracentesis as above  - Albumin only 2.9 and albumin will be given q6h with Lasix 20 mg IV q12h  - Follow daily wts and I/O's  - RLE much larger than left and venous US done and is Negative for DVT in the Right lower extremity and no Bakers Cyst  8. Mild Leukocytosis -WBC went from 12.6 -> 12.4 -> 13.6 -No Source of Infection Identified; Fluid Analysis sent  -Was concerned for SBP but Fluid Analysis showed 91 WBC so Abx will be stopped -Blood Cx's Ordered by Gastroenterology and pending -Repeat CBC in AM  9. Hyperammonemia -Ammonia Level was 57 -Will Start Lactulose 20 grams BID -Repeat Ammonia Level in AM  10. Hypomagnesemia  -Patient's Mag Level was 1.5 -Replete -Repeat Mag Level in AM  DVT prophylaxis: Lovenox 40 mg sq Code Status: FULL CODE Family Communication: Discussed with Children bedside Disposition Plan: Will get PT to Evaluate  Consultants:   Interventional Radiology  Gastroenterology   Procedures: Paracentesis  Antimicrobials: Anti-infectives    Start     Dose/Rate Route Frequency Ordered Stop   12/31/16 1100  cefTRIAXone (ROCEPHIN) 2 g in dextrose 5 % 50 mL IVPB     2 g 100 mL/hr over 30 Minutes Intravenous Every 24  hours 12/31/16 1050         Subjective: Seen and examined and denied any abdominal Pain. Thinks swelling maybe improving. No nausea or vomiting. Had some shakiness today on exam. No other complaints or concerns.   Objective: Vitals:   12/30/16 1500 12/31/16 0015 12/31/16 0634 12/31/16 1451  BP: (!) 141/93 (!) 175/94 (!) 155/97 (!) 147/88  Pulse:  (!) 116 (!) 104 (!) 101  Resp:  _0 Temp:  99.7 F (37.6 C) 99.1 F (37.3 C) 97.9 F (36.6 C)  TempSrc:  Oral Oral Oral  SpO2:  97% 97% 95%  Weight:   100.9 kg (222 lb 7.1 oz)   Height:        Intake/Output Summary (Last 24 hours) at 12/31/16 1458 Last data filed at 12/31/16 1300  Gross per 24 hour  Intake              320 ml  Output             1650 ml  Net            -1330 ml   Filed Weights   12/29/16 2329  12/30/16 0629 12/31/16 0634  Weight: 106.8 kg (235 lb 7.2 oz) 106.4 kg (234 lb 9.1 oz) 100.9 kg (222 lb 7.1 oz)   Examination: Physical Exam:  Constitutional: NAD patient with severe Jaundice Eyes: Lids normal, sclerae icterus noted  ENMT: External Ears, Nose appear normal. Grossly normal hearing. Mucous membranes are moist.  Neck: Appears normal, supple, no cervical masses, normal ROM, no appreciable thyromegaly Respiratory: Diminished to auscultation bilaterally, no wheezing, rales, rhonchi or crackles. Normal respiratory effort and patient is not tachypenic. No accessory muscle use.  Cardiovascular: RRR, no appreciable murmurs / rubs / gallops. S1 and S2 auscultated. 2+ Bilateral Edema with Right worse than left Abdomen: Soft, non-tender, distended with fluid wave shift. No masses palpated. Bowel sounds positive x4.  GU: Deferred. Musculoskeletal: No clubbing / cyanosis of digits/nails. No joint deformity upper and lower extremities. Good ROM, no contractures. Skin: Yellowish hue of skin, No rashes, lesions or ulcers noted. Neurologic: CN 2-12 grossly intact with no focal deficits. No Asterixis noted Psychiatric:  Normal judgment and insight. Normal mood and appropriate affect.   Data Reviewed: I have personally reviewed following labs and imaging studies  CBC:  Recent Labs Lab 12/29/16 1940 12/30/16 0144 12/31/16 0600  WBC 12.6* 12.4* 13.6*  NEUTROABS 10.8* 11.2* 11.5*  HGB 8.4* 7.4* 8.0*  HCT 25.0* 21.8* 24.3*  MCV 96.9 98.6 98.0  PLT 127* 109* 970*   Basic Metabolic Panel:  Recent Labs Lab 12/29/16 1940 12/30/16 0144 12/31/16 0600  NA 131* 131* 135  K 3.4* 3.3* 3.4*  CL 97* 98* 100*  CO2 23 21* 23  GLUCOSE 107* 105* 94  BUN 22* 22* 18  CREATININE 1.14 1.01 0.85  CALCIUM 8.1* 7.9* 8.4*  MG  --  1.7 1.5*  PHOS  --   --  3.0   GFR: Estimated Creatinine Clearance: 121.5 mL/min (by C-G formula based on SCr of 0.85 mg/dL). Liver Function Tests:  Recent Labs Lab 12/29/16 1940 12/30/16 0144 12/31/16 0600  AST 131* 129* 125*  ALT 33 31 33  ALKPHOS 244* 224* 218*  BILITOT 17.2* 16.8* 18.0*  PROT 7.1 7.2 7.2  ALBUMIN 2.4* 2.5* 2.9*   No results for input(s): LIPASE, AMYLASE in the last 168 hours.  Recent Labs Lab 12/31/16 0600  AMMONIA 57*   Coagulation Profile:  Recent Labs Lab 12/29/16 1940 12/30/16 0144  INR 1.40 1.41   Cardiac Enzymes:  Recent Labs Lab 12/29/16 1940  TROPONINI 0.03*   BNP (last 3 results) No results for input(s): PROBNP in the last 8760 hours. HbA1C: No results for input(s): HGBA1C in the last 72 hours. CBG:  Recent Labs Lab 12/31/16 0748  GLUCAP 92   Lipid Profile: No results for input(s): CHOL, HDL, LDLCALC, TRIG, CHOLHDL, LDLDIRECT in the last 72 hours. Thyroid Function Tests: No results for input(s): TSH, T4TOTAL, FREET4, T3FREE, THYROIDAB in the last 72 hours. Anemia Panel:  Recent Labs  12/29/16 1955 12/29/16 2345  VITAMINB12  --  1,748*  FOLATE  --  14.6  FERRITIN  --  73  TIBC  --  246*  IRON  --  30*  RETICCTPCT 2.9  --    Sepsis Labs: No results for input(s): PROCALCITON, LATICACIDVEN in the last 168  hours.  Recent Results (from the past 240 hour(s))  Body fluid culture     Status: None (Preliminary result)   Collection Time: 12/30/16  2:49 PM  Result Value Ref Range Status   Specimen Description Peritoneal  Final   Special  Requests NONE  Final   Gram Stain   Final    FEW WBC PRESENT, PREDOMINANTLY MONONUCLEAR NO ORGANISMS SEEN    Culture   Final    NO GROWTH < 24 HOURS Performed at [facility] [address] [place], [place] [address]    Report Status PENDING  Incomplete    Radiology Studies: Dg Chest 2 View  Result Date: 12/29/2016 CLINICAL DATA:  Shortness of breath, fatigue, and lower extremity swelling for 2 weeks. Jaundice. EXAM: CHEST  2 VIEW COMPARISON:  None. FINDINGS: The heart size and mediastinal contours are within normal limits. Low lung volumes noted. Linear opacities in both lung bases may be due to atelectasis or scarring. No evidence of pulmonary consolidation or edema. No evidence of pneumothorax or pleural effusion. IMPRESSION: Low lung volumes, with bibasilar atelectasis versus scarring. Electronically Signed   By: Earle Gell M.D.   On: 12/29/2016 20:04   Ct Abdomen Pelvis W Contrast  Result Date: 12/29/2016 CLINICAL DATA:  Initial evaluation for acute generalized weakness with fatigue. Evaluate for cirrhosis. EXAM: CT ABDOMEN AND PELVIS WITH CONTRAST TECHNIQUE: Multidetector CT imaging of the abdomen and pelvis was performed using the standard protocol following bolus administration of intravenous contrast. CONTRAST:  163m ISOVUE-300 IOPAMIDOL (ISOVUE-300) INJECTION 61% COMPARISON:  None available. FINDINGS: Lower chest: Scattered patchy and linear atelectatic changes present within the visualized lung bases. Visualized lung bases are otherwise clear. Extensive 3 vessel coronary artery calcifications noted. Hepatobiliary: The liver is heterogeneous in appearance with a nodular contour and caudate lobe hypertrophy, compatible with cirrhosis. Superimposed  2.4 cm hypodense lesion noted within the central aspect of the liver (series 2, image 28), indeterminate. No other definite focal lesions identified. Small granuloma noted. Evidence of portal hypertension with the recannulized paraumbilical vein. Large varix noted within the left abdomen as well. Few scattered smaller varices present within the upper abdomen. Trace hyperdensity within the gallbladder lumen, likely small stone. No biliary dilatation. Pancreas: Pancreas within normal limits. Spleen: Spleen is enlarged measuring 15.2 cm in craniocaudad dimension. Adrenals/Urinary Tract: Adrenal glands are normal. Kidneys equal in size with symmetric enhancement. No nephrolithiasis, hydronephrosis, or focal enhancing renal mass. No hydroureter. Bladder within normal limits. Stomach/Bowel: Stomach within normal limits. No evidence for bowel obstruction. Scattered hazy stranding and fluid about the small and large bowel felt to be related to underlying intrinsic liver disease. Colonic diverticulosis without definite evidence for acute diverticulitis. Appendix is normal. No definite acute inflammatory changes about the bowels. Vascular/Lymphatic: Extensive aorto bi-iliac atherosclerotic disease. No aneurysm. No pathologically enlarged intra-abdominal or pelvic lymph nodes. Reproductive: Prostate somewhat enlarged with median lobe hypertrophy, invaginating upon the base of the bladder. Other: No free intraperitoneal air. Moderate volume ascites within the abdomen and pelvis. Small amount of fluid extends through a small paraumbilical hernia. Musculoskeletal:  Anasarca noted. No acute osseous abnormality. No worrisome lytic or blastic osseous lesions. Degenerative spondylolysis noted within the lumbar spine, greatest at L5-S1. IMPRESSION: 1. Cirrhosis with moderate volume ascites and evidence of portal hypertension as detailed above. 2. 2.4 cm hypodense lesion within the central aspect of the liver, indeterminate. Given the  underlying intrinsic liver disease, further evaluation with dedicated MRI is recommended. 3. Colonic diverticulosis without definite evidence for acute diverticulitis. 4. Cholelithiasis. 5. Extensive coronary artery calcifications with moderate aorto bi-iliac atherosclerotic disease. Electronically Signed   By: BJeannine BogaM.D.   On: 12/29/2016 21:34   Mr Liver W WTGContrast  Result Date: 12/30/2016 CLINICAL DATA:  Inpatient.  Alcoholic cirrhosis with ascites. Indeterminate liver lesion on CT study performed 1 day prior. EXAM: MRI ABDOMEN WITHOUT AND WITH CONTRAST TECHNIQUE: Multiplanar multisequence MR imaging of the abdomen was performed both before and after the administration of intravenous contrast. CONTRAST:  10 cc Eovist IV. COMPARISON:  12/29/2016 CT abdomen/ pelvis FINDINGS: Motion degraded scan. Lower chest: Stable hypoventilatory changes versus scarring at the right greater than left lung bases. Hepatobiliary: Hepatomegaly. Diffusely mildly irregular liver surface, compatible with cirrhosis. Heterogeneous moderate diffuse hepatic steatosis. There is a 1.8 x 1.4 cm focus of abnormal signal intensity in the segment 8 right liver lobe (series 504/ image 44), which demonstrates loss of signal intensity on out of phase chemical shift imaging indicating internal fat, which is occult on the diffusion, T2 weighted, precontrast T1 and hepatobiliary phase postcontrast sequences, with no arterial phase hyperenhancement, and with questionable washout on the portal venous phase sequences, considered LI-RADS category 3. No additional liver lesions. Normal gallbladder with no cholelithiasis. No biliary ductal dilatation. Common bile duct diameter 3 mm. No choledocholithiasis. Pancreas: No pancreatic mass or duct dilation. No evidence of pancreas divisum. Spleen: Mild splenomegaly (craniocaudal splenic length 15.9 cm). No splenic mass. Adrenals/Urinary Tract: Normal adrenals. No hydronephrosis. Normal kidneys  with no renal mass. Stomach/Bowel: Grossly normal stomach. Visualized small and large bowel is normal caliber, with no bowel wall thickening. Vascular/Lymphatic: Normal caliber abdominal aorta. Patent portal, splenic, hepatic and renal veins. Small paraumbilical varices. No pathologically enlarged lymph nodes in the abdomen. Other: Small to moderate volume abdominal ascites. No focal fluid collection. Musculoskeletal: No aggressive appearing focal osseous lesions. IMPRESSION: 1. Cirrhosis.  Heterogeneous diffuse hepatic steatosis. 2. Segment 8 right liver lobe 1.8 cm liver lesion, considered LI-RADS category 3 (intermediate probability for [facility]). Follow-up MRI abdomen without and with IV contrast recommended in 3-6 months. 3. Mild splenomegaly. Small to moderate volume abdominal ascites. Small paraumbilical varices. Electronically Signed   By: Ilona Sorrel M.D.   On: 12/30/2016 12:42   US Paracentesis  Result Date: 12/30/2016 INDICATION: Jaundice, elevated liver function tests, cirrhosis, liver lesion, ascites. Request made for diagnostic and therapeutic paracentesis. EXAM: ULTRASOUND GUIDED DIAGNOSTIC AND THERAPEUTIC PARACENTESIS MEDICATIONS: None. COMPLICATIONS: None immediate. PROCEDURE: Informed written consent was obtained from the patient after a discussion of the risks, benefits and alternatives to treatment. A timeout was performed prior to the initiation of the procedure. Initial ultrasound scanning demonstrates a small to moderate amount of ascites within the right lower abdominal quadrant. The right lower abdomen was prepped and draped in the usual sterile fashion. 1% lidocaine was used for local anesthesia. Following this, a Yueh catheter was introduced. An ultrasound image was saved for documentation purposes. The paracentesis was performed. The catheter was removed and a dressing was applied. The patient tolerated the procedure well without immediate post procedural complication. FINDINGS: A total  of approximately 2.9 liters of golden yellow fluid was removed. Samples were sent to the laboratory as requested by the clinical team. IMPRESSION: Successful ultrasound-guided diagnostic and therapeutic paracentesis yielding 2.9 liters of peritoneal fluid. Read by: Rowe Robert, PA-C Electronically Signed   By: Jerilynn Mages.  Shick M.D.   On: 12/30/2016 16:18   Scheduled Meds: . cefTRIAXone (ROCEPHIN)  IV  2 g Intravenous Q24H  . enoxaparin (LOVENOX) injection  40 mg Subcutaneous QHS  . famotidine  20 mg Oral BID  . folic acid  1 mg Oral Daily  . furosemide  20 mg Intravenous Q12H  . lactulose  20 g Oral BID  . LORazepam  0-4 mg Oral Q6H   Followed by  . [START ON 01/01/2017] LORazepam  0-4 mg Oral Q12H  . multivitamin with minerals  1 tablet Oral Daily  . potassium chloride  40 mEq Oral BID  . sodium chloride flush  3 mL Intravenous Q12H  . thiamine  100 mg Oral Daily   Or  . thiamine  100 mg Intravenous Daily   Continuous Infusions:   LOS: 2 days   Kerney Elbe, DO [facility] Pager [phone]  If 7PM-7AM, please contact night-coverage [email or web address] Password [facility] 12/31/2016, 2:58 PM

## 2016-12-31 NOTE — Progress Notes (Signed)
Pharmacy: Ceftriaxone  Patient's a 51 y.o M with hx EtOH abuse presented to the ED on 12/29/16 with c/o weakness, abd distention and jaundice.  Abd CTA on 4/13 showed cirrhosis with ascites and evidence of portal HTN. s/p paracentesis on 12/30/16 yielding 2.9 liters of peritoneal fluid.  To start ceftriaxone for suspected SBP.  Of note, patient reported childhood reaction to PCN.  Will ask RN to watch patient closely with first dose of ceftriaxone.  Plan: - ceftriaxone 2 gm IV q24h - no renal adjustment is needed with ceftriaxone.  Pharmacy will sign off.  Re-consult [place] if need further assistance.  Dorna Leitz, PharmD, BCPS 12/31/2016 11:02 AM

## 2017-01-01 ENCOUNTER — Other Ambulatory Visit (HOSPITAL_COMMUNITY): Payer: Self-pay

## 2017-01-01 DIAGNOSIS — R945 Abnormal results of liver function studies: Secondary | ICD-10-CM

## 2017-01-01 LAB — AMMONIA: Ammonia: 58 umol/L — ABNORMAL HIGH (ref 9–35)

## 2017-01-01 LAB — HEPATITIS PANEL, ACUTE
HCV AB: 0.1 {s_co_ratio} (ref 0.0–0.9)
HEP B S AG: NEGATIVE
Hep A IgM: NEGATIVE
Hep B C IgM: NEGATIVE

## 2017-01-01 LAB — CBC WITH DIFFERENTIAL/PLATELET
Basophils Absolute: 0.1 10*3/uL (ref 0.0–0.1)
Basophils Relative: 1 %
Eosinophils Absolute: 0.3 10*3/uL (ref 0.0–0.7)
Eosinophils Relative: 2 %
HCT: 24.1 % — ABNORMAL LOW (ref 39.0–52.0)
Hemoglobin: 8 g/dL — ABNORMAL LOW (ref 13.0–17.0)
LYMPHS ABS: 0.7 10*3/uL (ref 0.7–4.0)
LYMPHS PCT: 5 %
MCH: 32.9 pg (ref 26.0–34.0)
MCHC: 33.2 g/dL (ref 30.0–36.0)
MCV: 99.2 fL (ref 78.0–100.0)
Monocytes Absolute: 1 10*3/uL (ref 0.1–1.0)
Monocytes Relative: 7 %
NEUTROS ABS: 11.6 10*3/uL — AB (ref 1.7–7.7)
NEUTROS PCT: 85 %
Platelets: UNDETERMINED 10*3/uL (ref 150–400)
RBC: 2.43 MIL/uL — AB (ref 4.22–5.81)
RDW: 17.9 % — ABNORMAL HIGH (ref 11.5–15.5)
WBC: 13.6 10*3/uL — AB (ref 4.0–10.5)

## 2017-01-01 LAB — COMPREHENSIVE METABOLIC PANEL
ALBUMIN: 2.7 g/dL — AB (ref 3.5–5.0)
ALK PHOS: 188 U/L — AB (ref 38–126)
ALT: 33 U/L (ref 17–63)
ANION GAP: 13 (ref 5–15)
AST: 117 U/L — ABNORMAL HIGH (ref 15–41)
BUN: 17 mg/dL (ref 6–20)
CO2: 23 mmol/L (ref 22–32)
Calcium: 8.3 mg/dL — ABNORMAL LOW (ref 8.9–10.3)
Chloride: 99 mmol/L — ABNORMAL LOW (ref 101–111)
Creatinine, Ser: 0.56 mg/dL — ABNORMAL LOW (ref 0.61–1.24)
GFR calc Af Amer: >60 mL/min (ref >60)
GFR calc non Af Amer: >60 mL/min (ref >60)
GLUCOSE: 89 mg/dL (ref 65–99)
POTASSIUM: 3.5 mmol/L (ref 3.5–5.1)
Sodium: 135 mmol/L (ref 135–145)
Total Bilirubin: 18.5 mg/dL (ref 0.3–1.2)
Total Protein: 7 g/dL (ref 6.5–8.1)

## 2017-01-01 LAB — AFP TUMOR MARKER: AFP-Tumor Marker: 3 ng/mL (ref 0.0–8.3)

## 2017-01-01 LAB — PROTIME-INR
INR: 1.46
Prothrombin Time: 17.9 seconds — ABNORMAL HIGH (ref 11.4–15.2)

## 2017-01-01 LAB — MAGNESIUM: MAGNESIUM: 1.5 mg/dL — AB (ref 1.7–2.4)

## 2017-01-01 LAB — GLUCOSE, CAPILLARY: Glucose-Capillary: 92 mg/dL (ref 65–99)

## 2017-01-01 LAB — PHOSPHORUS: PHOSPHORUS: 3.2 mg/dL (ref 2.5–4.6)

## 2017-01-01 MED ORDER — SPIRONOLACTONE 25 MG PO TABS
50.0000 mg | ORAL_TABLET | Freq: Every day | ORAL | Status: DC
  Administered 2017-01-01 – 2017-01-02 (×2): 50 mg via ORAL
  Filled 2017-01-01 (×2): qty 2

## 2017-01-01 MED ORDER — PREDNISOLONE 5 MG PO TABS
40.0000 mg | ORAL_TABLET | Freq: Every day | ORAL | Status: DC
  Administered 2017-01-01 – 2017-01-05 (×5): 40 mg via ORAL
  Filled 2017-01-01 (×6): qty 8

## 2017-01-01 MED ORDER — MAGNESIUM SULFATE 2 GM/50ML IV SOLN
2.0000 g | Freq: Once | INTRAVENOUS | Status: AC
  Administered 2017-01-01: 2 g via INTRAVENOUS
  Filled 2017-01-01: qty 50

## 2017-01-01 MED ORDER — LACTULOSE 10 GM/15ML PO SOLN
30.0000 g | Freq: Two times a day (BID) | ORAL | Status: DC
Start: 2017-01-01 — End: 2017-01-05
  Administered 2017-01-01 – 2017-01-04 (×6): 30 g via ORAL
  Filled 2017-01-01 (×7): qty 60

## 2017-01-01 MED ORDER — PANTOPRAZOLE SODIUM 40 MG PO TBEC
40.0000 mg | DELAYED_RELEASE_TABLET | Freq: Every day | ORAL | Status: DC
  Administered 2017-01-01 – 2017-01-05 (×5): 40 mg via ORAL
  Filled 2017-01-01 (×5): qty 1

## 2017-01-01 NOTE — Progress Notes (Signed)
Patient displays decreased symptoms of alcohol withdrawal. He has gotten OOB several times today and sat in the chair as well as transferred to the [facility]. LLE edema has decreased in size, but remains 3+ pitting. Pt legs elevated while in bed/chair. Patient has been educated on jaundice,  signs/symptoms of ETOH withdrawal to report to nurse, importance of alcohol intake cessation to prevent further liver damage, and mobility limitations to prevent injury. Patient has been instructed to call for assistance when getting OOB. Bed is in lowest position and call bell is within reach. Family is at the bedside (daughter and son).     Tenna Child, Student RN

## 2017-01-01 NOTE — Progress Notes (Signed)
01/01/17  1329  Notified MD telemetry called stating patient had a 6 beat run of Vtach. BP 133/79  HR 103

## 2017-01-01 NOTE — Progress Notes (Signed)
Subjective: No complaints.  Objective: Vital signs in last 24 hours: Temp:  [97.9 F (36.6 C)-98.9 F (37.2 C)] 98.2 F (36.8 C) (04/14 1212) Pulse Rate:  [95-115] 103 (04/14 1212) Resp:  [17-20] 20 (04/14 1212) BP: (136-156)/(80-96) 136/80 (04/14 1212) SpO2:  [95 %-98 %] 96 % (04/14 1212) Weight:  [97.8 kg (215 lb 9.8 oz)] 97.8 kg (215 lb 9.8 oz) (04/14 0626) Weight change: -3.1 kg (-6 lb 13.4 oz) Last BM Date: 12/30/16  PE: GEN:  Older-appearing than stated age SKIN:  Jaundiced, scattered ecchymoses HEENT:  Scleral icterus NEURO:  Alert, but tremulous. ABD:  Moderate ascites, non-tender EXT:  2-3+ pitting edema BLE  Lab Results: CBC    Component Value Date/Time   WBC 13.6 (H) 01/01/2017 0527   RBC 2.43 (L) 01/01/2017 0527   HGB 8.0 (L) 01/01/2017 0527   HCT 24.1 (L) 01/01/2017 0527   PLT PLATELET CLUMPS NOTED ON SMEAR, UNABLE TO ESTIMATE 01/01/2017 0527   MCV 99.2 01/01/2017 0527   MCH 32.9 01/01/2017 0527   MCHC 33.2 01/01/2017 0527   RDW 17.9 (H) 01/01/2017 0527   LYMPHSABS 0.7 01/01/2017 0527   MONOABS 1.0 01/01/2017 0527   EOSABS 0.3 01/01/2017 0527   BASOSABS 0.1 01/01/2017 0527   CMP     Component Value Date/Time   NA 135 01/01/2017 0527   K 3.5 01/01/2017 0527   CL 99 (L) 01/01/2017 0527   CO2 23 01/01/2017 0527   GLUCOSE 89 01/01/2017 0527   BUN 17 01/01/2017 0527   CREATININE 0.56 (L) 01/01/2017 0527   CALCIUM 8.3 (L) 01/01/2017 0527   PROT 7.0 01/01/2017 0527   ALBUMIN 2.7 (L) 01/01/2017 0527   AST 117 (H) 01/01/2017 0527   ALT 33 01/01/2017 0527   ALKPHOS 188 (H) 01/01/2017 0527   BILITOT 18.5 (HH) 01/01/2017 0527   GFRNONAA >60 01/01/2017 0527   GFRAA >60 01/01/2017 0527   Assessment:  1.  Alcoholic hepatitis. 2.  Alcohol-mediated cirrhosis. 3.  Volume overload (edema, ascites).  SAAG > 1.1.  No SBP on paracentesis. 4.  Alcohol abuse. 5.  Liver lesion on imaging, but normal AFP.  Plan:  1.  Patient's discriminant function is only  30, but has been started on prednisolone. 2.  PT/OT evaluation. 3.  Continue diuretics, and add spironolactone 50 mg po qd. 4.  Long discussion about mandatory alcohol abstinence, non-compliance of which is expected to rapidly diminish his clinical course. 5.  Will need inpatient PT likely and then protracted inpatient alcohol detox program. 6.  Low sodium diet. 7.  Minimize non-essential IV fluids. 8.  [facility] will follow.  Over 35 minutes spent in patient encounter, over 50% of which was utilized in direct patient counseling and coordination-of-care.    Richard Bird 01/01/2017, 12:30 PM   Pager [phone] If no answer or after 5 PM call [phone]

## 2017-01-01 NOTE — Progress Notes (Signed)
PROGRESS NOTE    Richard Bird  WJX:[id number] DOB: [birth year] DOA: 12/29/2016 PCP: Chevis Pretty, MD  Brief Narrative:  Richard Bird is a 51 y.o. male with medical history significant for long-standing alcohol dependence who presents the emergency department for evaluation of general weakness, shortness of breath, jaundice, abdominal distention, and bilateral lower extremity edema. Patient reports a long history of daily alcohol use, approximately 5-10 drinks per day and had otherwise been in his usual state of health until approximately 3 weeks ago when he noted the insidious development of right lower extremity swelling. Over the next week or so, he developed swelling in the left leg as well and also noted progressive abdominal distention and yellowing of his skin and eyes. Over the ensuing days, all of these complaints were progressively worsening and the patient was becoming increasingly short of breath with exertion. He denies any fevers or chills, denies chest pain or palpitations, and denies dyspnea while at rest, or cough. There has been no significant nausea, no vomiting, no diarrhea, and no melena or hematochezia. Patient denies any use of illicit substances and denies any history of IV drug abuse. He denies a family history of liver disease. He reports abstaining from alcohol for up to a couple months at a time remotely, but has not gone a day without drinking in many months. He denies history of DTs or seizure, but has noted mild withdrawal symptoms in the past which she has relieved with drinking. Admitted for Decompensated Cirrhosis and found to have a liver Lesion. Gastroenterology was consulter for further evaluation and recommendations for Liver Cirrhosis.   Assessment & Plan:   Principal Problem:   Cirrhosis of liver with ascites (HCC) Active Problems:   Alcoholic cirrhosis of liver with ascites (HCC)   Hyponatremia   Hypokalemia   LFTs abnormal   Coagulopathy (HCC)  Normocytic anemia   Hepatitis, alcoholic, acute   Liver lesion  1. Alcoholic hepatitis/Decompensated cirrhosis  - Pt with long hx of heavy daily alcohol use, now p/w jaundice, ascites, anasarca - Noted to have t bili 17 with alk phos 244, AST 131, and ALT 33, INR 1.40 -Repet TBili now (18.0) along with Alk Phos (218) and AST (125) - CT abd/pelvis reveals cirrhotic liver, evidence for portal HTN, and central hypodense liver lesion - Pt was counseled regarding critical importance of abstinence from alcohol going forward; family at bedside are very supportive - Plan to start acid-suppression, replete lytes, supplement vitamins and minerals, monitor with CIWA and prn Ativan - US-guided paracentesis with fluid analysis and done and drained  2.9 Liters of golden fluid that was sent for Analysis -Initial Concern for SBP but WBC was 91 so will Stopped Abx - Maddrey discriminant function score is 39 on admission and was 26.5 yesterday's blood work -MELD Score 25 - Discriminant Function Score (My calculation it was 42 and GI's calculation was 30)  -Viral Hepatitis panel Negative -Started Patient on Prednisolone 40 mg po Daily and discussed with GI and ok to continue for now -Low Salt Diet Started -C/w Albumin 25 g IV q6h and IV Lasix 20 mg IV q12h; Gastroenterology started Spironolactone 50 mg qdaily. -Checked Ammonia Level and was 58; Increased Lactulose 20 grams po BID to 30 mg po BID and repeat Ammonia Level in AM -Gastroenterology Consulted for further evaluation of decompensate Liver Cirrhosis and appreciate Recc's -Dr. Paulita Fujita recommending non-essential IV Fluids  2. Liver lesion  - 2.4 cm hypodense lesion noted in central liver on CT -MRI showed  Segment 8 right liver lobe 1.8 cm liver lesion, considered LI-RADS category 3 (intermediate probability for [facility]). Follow-up MRI abdomen without and with IV contrast recommended in 3-6 months. -AFP 3.2  3. Hyponatremia, improved - Serum sodium 131  on admission, and repeat was 135 - Secondary to cirrhosis with reduced effective arterial blood volume - Repeat chem panel in AM   4. Hypokalemia  - Serum potassium is 3.4 on admission and was 3.5 this AM -Given KCl 40 mEQ BID;  -Repeat CMP  5. Coagulapathy  - INR 1.40 on admission without any apparent bleeding and repeat was 1.46;  - Likely secondary to liver disease; will monitor   6. Anemia,Thrombocytopenia  - Hgb is 8.4 on admission and platelets 127k; Now 8.0 and Platelets not calculated today as they clumbed - There is no apparent bleeding, and no pallor  - Likely secondary to alcohol abuse and liver disease - Type and screen performed; anemia panel sent  - Anemia Panel shows Iron Level of 30, TIBC of 246, Sat Ratio of 12, and Ferritin Level of 73 -Repeat CBC in AM  7. Anasarca  - Pt presents with anasarca secondary to decompensated cirrhosis  -Paracentesis as above  - Albumin only 2.9 and Albumin will be given q6h with Lasix 20 mg IV q12h  - Gastroenterology started patient on Spironolactone 50 mg po qdaily - Follow daily wts and I/O's  - RLE much larger than left and venous US done and is Negative for DVT in the Right lower extremity and no Bakers Cyst -Check Transthoracic Echo to r/o Heart Failure Componenet  8. Mild Leukocytosis -WBC went from 12.6 -> 12.4 -> 13.6 -> 13.6 -No Source of Infection Identified; Fluid Analysis sent  -Was concerned for SBP but Fluid Analysis showed 91 WBC so Abx will be stopped -Blood Cx's Ordered by Gastroenterology and pending -Repeat CBC in AM  9. Hyperammonemia -Ammonia Level was 58 -Will Increase Lactulose 20 grams BID to 30 mg po BID -Repeat Ammonia Level in AM  10. Hypomagnesemia  -Patient's Mag Level was 1.5 -Replete with IV Mag Sulfate -Repeat Mag Level in AM  DVT prophylaxis: Lovenox 40 mg sq Code Status: FULL CODE Family Communication: Discussed with Children bedside Disposition Plan: SNF when medically  stable  Consultants:   Interventional Radiology  Gastroenterology   Procedures: Paracentesis  Antimicrobials: Anti-infectives    Start     Dose/Rate Route Frequency Ordered Stop   12/31/16 1100  cefTRIAXone (ROCEPHIN) 2 g in dextrose 5 % 50 mL IVPB  Status:  Discontinued     2 g 100 mL/hr over 30 Minutes Intravenous Every 24 hours 12/31/16 1050 12/31/16 1522       Subjective: Seen and examined and denied any abdominal Pain. No nausea or vomiting and felt better than yesterday. No other complaints or concerns.   Objective: Vitals:   01/01/17 0837 01/01/17 0856 01/01/17 1212 01/01/17 1325  BP: (!) 156/94  136/80 133/79  Pulse: (!) 102 99 (!) 103 (!) 103  Resp: 17  20   Temp: 98.5 F (36.9 C)  98.2 F (36.8 C) 98.4 F (36.9 C)  TempSrc: Oral  Oral   SpO2: 97%  96% 97%  Weight:      Height:        Intake/Output Summary (Last 24 hours) at 01/01/17 1711 Last data filed at 01/01/17 1318  Gross per 24 hour  Intake              540 ml  Output  725 ml  Net             -185 ml   Filed Weights   12/30/16 0629 12/31/16 0634 01/01/17 0626  Weight: 106.4 kg (234 lb 9.1 oz) 100.9 kg (222 lb 7.1 oz) 97.8 kg (215 lb 9.8 oz)   Examination: Physical Exam:  Constitutional: NAD patient with severe Jaundice Eyes: Lids normal, scleral icterus noted  ENMT: External Ears, Nose appear normal. Grossly normal hearing.  Neck: Appears normal, supple, no cervical masses, normal ROM, no appreciable thyromegaly Respiratory: Diminished to auscultation bilaterally, no wheezing, rales, rhonchi or crackles. Normal respiratory effort and patient is not tachypenic. No accessory muscle use.  Cardiovascular: RRR, no appreciable murmurs / rubs / gallops. S1 and S2 auscultated. 2+ Bilateral Edema with Right worse than left Abdomen: Soft, non-tender, distended with fluid wave shift. No masses palpated. Bowel sounds positive x4.  GU: Deferred. Musculoskeletal: No clubbing / cyanosis of  digits/nails. No joint deformity upper and lower extremities. Good ROM, no contractures. Skin: Yellowish hue of skin, No rashes, lesions or ulcers noted. Neurologic: CN 2-12 grossly intact with no focal deficits. No Asterixis noted but had slight tremors Psychiatric: Normal judgment and insight. Normal mood and appropriate affect.   Data Reviewed: I have personally reviewed following labs and imaging studies  CBC:  Recent Labs Lab 12/29/16 1940 12/30/16 0144 12/31/16 0600 01/01/17 0527  WBC 12.6* 12.4* 13.6* 13.6*  NEUTROABS 10.8* 11.2* 11.5* 11.6*  HGB 8.4* 7.4* 8.0* 8.0*  HCT 25.0* 21.8* 24.3* 24.1*  MCV 96.9 98.6 98.0 99.2  PLT 127* 109* 116* PLATELET CLUMPS NOTED ON SMEAR, UNABLE TO ESTIMATE   Basic Metabolic Panel:  Recent Labs Lab 12/29/16 1940 12/30/16 0144 12/31/16 0600 01/01/17 0527  NA 131* 131* 135 135  K 3.4* 3.3* 3.4* 3.5  CL 97* 98* 100* 99*  CO2 23 21* 23 23  GLUCOSE 107* 105* 94 89  BUN 22* 22* 18 17  CREATININE 1.14 1.01 0.85 0.56*  CALCIUM 8.1* 7.9* 8.4* 8.3*  MG  --  1.7 1.5* 1.5*  PHOS  --   --  3.0 3.2   GFR: Estimated Creatinine Clearance: 127.3 mL/min (A) (by C-G formula based on SCr of 0.56 mg/dL (L)). Liver Function Tests:  Recent Labs Lab 12/29/16 1940 12/30/16 0144 12/31/16 0600 01/01/17 0527  AST 131* 129* 125* 117*  ALT 33 31 33 33  ALKPHOS 244* 224* 218* 188*  BILITOT 17.2* 16.8* 18.0* 18.5*  PROT 7.1 7.2 7.2 7.0  ALBUMIN 2.4* 2.5* 2.9* 2.7*   No results for input(s): LIPASE, AMYLASE in the last 168 hours.  Recent Labs Lab 12/31/16 0600 01/01/17 0527  AMMONIA 57* 58*   Coagulation Profile:  Recent Labs Lab 12/29/16 1940 12/30/16 0144 01/01/17 0527  INR 1.40 1.41 1.46   Cardiac Enzymes:  Recent Labs Lab 12/29/16 1940  TROPONINI 0.03*   BNP (last 3 results) No results for input(s): PROBNP in the last 8760 hours. HbA1C: No results for input(s): HGBA1C in the last 72 hours. CBG:  Recent Labs Lab  12/31/16 0748 01/01/17 0756  GLUCAP 92 92   Lipid Profile: No results for input(s): CHOL, HDL, LDLCALC, TRIG, CHOLHDL, LDLDIRECT in the last 72 hours. Thyroid Function Tests: No results for input(s): TSH, T4TOTAL, FREET4, T3FREE, THYROIDAB in the last 72 hours. Anemia Panel:  Recent Labs  12/29/16 1955 12/29/16 2345  VITAMINB12  --  1,748*  FOLATE  --  14.6  FERRITIN  --  73  TIBC  --  246*  IRON  --  30*  RETICCTPCT 2.9  --    Sepsis Labs: No results for input(s): PROCALCITON, LATICACIDVEN in the last 168 hours.  Recent Results (from the past 240 hour(s))  Body fluid culture     Status: None (Preliminary result)   Collection Time: 12/30/16  2:49 PM  Result Value Ref Range Status   Specimen Description Peritoneal  Final   Special Requests NONE  Final   Gram Stain   Final    FEW WBC PRESENT, PREDOMINANTLY MONONUCLEAR NO ORGANISMS SEEN    Culture   Final    NO GROWTH 2 DAYS Performed at [facility], [address], [place], [place] [address]    Report Status PENDING  Incomplete  Culture, blood (routine x 2)     Status: None (Preliminary result)   Collection Time: 12/31/16  2:51 PM  Result Value Ref Range Status   Specimen Description BLOOD LEFT ANTECUBITAL  Final   Special Requests   Final    BOTTLES DRAWN AEROBIC AND ANAEROBIC Blood Culture adequate volume   Culture   Final    NO GROWTH < 24 HOURS Performed at [facility], [place] [address], [place], [place] [address]    Report Status PENDING  Incomplete  Culture, blood (routine x 2)     Status: None (Preliminary result)   Collection Time: 12/31/16  2:57 PM  Result Value Ref Range Status   Specimen Description BLOOD LEFT HAND  Final   Special Requests   Final    BOTTLES DRAWN AEROBIC AND ANAEROBIC Blood Culture adequate volume   Culture   Final    NO GROWTH < 24 HOURS Performed at [facility], [place] [address], [place], [place] [address]    Report Status PENDING  Incomplete    Radiology  Studies: No results found. Scheduled Meds: . enoxaparin (LOVENOX) injection  40 mg Subcutaneous QHS  . famotidine  20 mg Oral BID  . folic acid  1 mg Oral Daily  . furosemide  20 mg Intravenous Q12H  . lactulose  30 g Oral BID  . LORazepam  0-4 mg Oral Q12H  . multivitamin with minerals  1 tablet Oral Daily  . pantoprazole  40 mg Oral Daily  . potassium chloride  40 mEq Oral BID  . prednisoLONE  40 mg Oral Daily  . sodium chloride flush  3 mL Intravenous Q12H  . spironolactone  50 mg Oral Daily  . thiamine  100 mg Oral Daily   Continuous Infusions:   LOS: 3 days   Kerney Elbe, DO [facility] Pager [phone]  If 7PM-7AM, please contact night-coverage [email or web address] Password [email or web address] 01/01/2017, 5:11 PM

## 2017-01-01 NOTE — Evaluation (Signed)
Physical Therapy Evaluation Patient Details Name: Richard Bird MRN: [id number] DOB: 06-Aug-1966 Today's Date: 01/01/2017   History of Present Illness  Richard Bird is a 51 y.o. male with medical history significant for long-standing alcohol dependence who presents the emergency department for evaluation of general weakness, shortness of breath, jaundice, abdominal distention, and bilateral lower extremity edema  Clinical Impression  Pt admitted with above diagnosis. Pt currently with functional limitations due to the deficits listed below (see PT Problem List). * Pt will benefit from skilled PT to increase their independence and safety with mobility to allow discharge to the venue listed below.   Pt will benefit from SNF level care post acute, if makes tremendous progress with mobility then may be able to D/C home with HHPT--will continue to assess needs; HR 100 at rest, up to 120 with standing/transfer;  Pt demo's no insight into his current mobility deficits     Follow Up Recommendations SNF;Supervision/Assistance - 24 hour    Equipment Recommendations  None recommended by PT    Recommendations for Other Services       Precautions / Restrictions Precautions Precautions: Fall Restrictions Weight Bearing Restrictions: No      Mobility  Bed Mobility Overal bed mobility: Needs Assistance Bed Mobility: Supine to Sit     Supine to sit: Min assist;Mod assist;HOB elevated     General bed mobility comments: incr time, assist with trunk  Transfers Overall transfer level: Needs assistance Equipment used: Rolling walker (2 wheeled) Transfers: Sit to/from Stand;Lateral/Scoot Transfers Sit to Stand: Max assist;Mod assist        Lateral/Scoot Transfers: Min assist General transfer comment: heavy posterior lean on standing; cues for wt shift to stand, bringing  COG over BOS; assist with initial scooting and cues for anterior wt shift and hand placement,  Ambulation/Gait                 Stairs            Wheelchair Mobility    Modified Rankin (Stroke Patients Only)       Balance Overall balance assessment: Needs assistance (denies falls) Sitting-balance support: Feet supported;Bilateral upper extremity supported Sitting balance-Leahy Scale: Poor   Postural control: Posterior lean   Standing balance-Leahy Scale: Zero Standing balance comment: heavy posterior bias in standing                             Pertinent Vitals/Pain      Home Living Family/patient expects to be discharged to:: Skilled nursing facility Living Arrangements: Alone             Home Equipment: None Additional Comments: pt has 2 supportive children that live out of town, he does not really have any assist at home    Prior Function Level of Independence: Independent               Hand Dominance        Extremity/Trunk Assessment   Upper Extremity Assessment Upper Extremity Assessment: Defer to OT evaluation    Lower Extremity Assessment Lower Extremity Assessment: Generalized weakness;LLE deficits/detail;RLE deficits/detail RLE Coordination: decreased gross motor LLE Coordination: decreased gross motor       Communication   Communication: No difficulties  Cognition Arousal/Alertness: Awake/alert Behavior During Therapy: WFL for tasks assessed/performed Overall Cognitive Status: Impaired/Different from baseline Area of Impairment: Following commands;Awareness;Safety/judgement  Following Commands: Follows one step commands with increased time Safety/Judgement: Decreased awareness of deficits;Decreased awareness of safety            General Comments      Exercises     Assessment/Plan    PT Assessment Patient needs continued PT services  PT Problem List Decreased strength;Decreased activity tolerance;Decreased balance;Decreased mobility;Decreased cognition;Decreased coordination        PT Treatment Interventions DME instruction;Gait training;Functional mobility training;Therapeutic activities;Therapeutic exercise;Patient/family education    PT Goals (Current goals can be found in the Care Plan section)  Acute Rehab PT Goals Patient Stated Goal: pt agreeable to SNF PT Goal Formulation: With patient/family Time For Goal Achievement: 01/15/17 Potential to Achieve Goals: Good    Frequency Min 3X/week   Barriers to discharge        Co-evaluation               End of Session Equipment Utilized During Treatment: Gait belt Activity Tolerance: Patient tolerated treatment well;Patient limited by fatigue Patient left: with call bell/phone within reach;in chair;with chair alarm set;with family/visitor present Nurse Communication: Mobility status PT Visit Diagnosis: Difficulty in walking, not elsewhere classified (R26.2)    Time: 1610-9604 PT Time Calculation (min) (ACUTE ONLY): 18 min   Charges:   PT Evaluation $PT Eval Moderate Complexity: 1 Procedure     PT G CodesDrucilla Chalet, PT Pager: [phone] 01/01/2017   [facility] [place] 01/01/2017, 1:18 PM

## 2017-01-02 ENCOUNTER — Inpatient Hospital Stay (HOSPITAL_COMMUNITY): Payer: Medicaid Other

## 2017-01-02 DIAGNOSIS — I340 Nonrheumatic mitral (valve) insufficiency: Secondary | ICD-10-CM

## 2017-01-02 LAB — MAGNESIUM: Magnesium: 1.9 mg/dL (ref 1.7–2.4)

## 2017-01-02 LAB — CBC WITH DIFFERENTIAL/PLATELET
Basophils Absolute: 0 10*3/uL (ref 0.0–0.1)
Basophils Relative: 0 %
Eosinophils Absolute: 0.1 10*3/uL (ref 0.0–0.7)
Eosinophils Relative: 0 %
HEMATOCRIT: 25.7 % — AB (ref 39.0–52.0)
HEMOGLOBIN: 8.5 g/dL — AB (ref 13.0–17.0)
LYMPHS ABS: 0.8 10*3/uL (ref 0.7–4.0)
Lymphocytes Relative: 5 %
MCH: 33.3 pg (ref 26.0–34.0)
MCHC: 33.1 g/dL (ref 30.0–36.0)
MCV: 100.8 fL — ABNORMAL HIGH (ref 78.0–100.0)
MONOS PCT: 3 %
Monocytes Absolute: 0.5 10*3/uL (ref 0.1–1.0)
NEUTROS ABS: 13.8 10*3/uL — AB (ref 1.7–7.7)
NEUTROS PCT: 92 %
Platelets: 139 10*3/uL — ABNORMAL LOW (ref 150–400)
RBC: 2.55 MIL/uL — AB (ref 4.22–5.81)
RDW: 17.9 % — ABNORMAL HIGH (ref 11.5–15.5)
WBC: 15.2 10*3/uL — ABNORMAL HIGH (ref 4.0–10.5)

## 2017-01-02 LAB — COMPREHENSIVE METABOLIC PANEL
ALBUMIN: 2.5 g/dL — AB (ref 3.5–5.0)
ALT: 34 U/L (ref 17–63)
AST: 106 U/L — AB (ref 15–41)
Alkaline Phosphatase: 183 U/L — ABNORMAL HIGH (ref 38–126)
Anion gap: 11 (ref 5–15)
BILIRUBIN TOTAL: 18.2 mg/dL — AB (ref 0.3–1.2)
BUN: 17 mg/dL (ref 6–20)
CHLORIDE: 99 mmol/L — AB (ref 101–111)
CO2: 25 mmol/L (ref 22–32)
Calcium: 8.5 mg/dL — ABNORMAL LOW (ref 8.9–10.3)
Creatinine, Ser: 0.74 mg/dL (ref 0.61–1.24)
GFR calc Af Amer: >60 mL/min (ref >60)
GFR calc non Af Amer: >60 mL/min (ref >60)
GLUCOSE: 111 mg/dL — AB (ref 65–99)
Potassium: 3.4 mmol/L — ABNORMAL LOW (ref 3.5–5.1)
Sodium: 135 mmol/L (ref 135–145)
Total Protein: 7.1 g/dL (ref 6.5–8.1)

## 2017-01-02 LAB — PROTIME-INR
INR: 1.52
Prothrombin Time: 18.5 seconds — ABNORMAL HIGH (ref 11.4–15.2)

## 2017-01-02 LAB — ECHOCARDIOGRAM COMPLETE
Height: 71 in
Weight: 3442.7 oz

## 2017-01-02 LAB — PHOSPHORUS: Phosphorus: 4.7 mg/dL — ABNORMAL HIGH (ref 2.5–4.6)

## 2017-01-02 LAB — GLUCOSE, CAPILLARY: Glucose-Capillary: 102 mg/dL — ABNORMAL HIGH (ref 65–99)

## 2017-01-02 LAB — AMMONIA: AMMONIA: 32 umol/L (ref 9–35)

## 2017-01-02 MED ORDER — FUROSEMIDE 40 MG PO TABS: 40.0000 mg | ORAL_TABLET | Freq: Every day | ORAL | Status: DC

## 2017-01-02 MED ORDER — SPIRONOLACTONE 25 MG PO TABS
100.0000 mg | ORAL_TABLET | Freq: Every day | ORAL | Status: DC
  Administered 2017-01-03 – 2017-01-05 (×3): 100 mg via ORAL
  Filled 2017-01-02 (×3): qty 4

## 2017-01-02 MED ORDER — LORAZEPAM 1 MG PO TABS
1.0000 mg | ORAL_TABLET | Freq: Once | ORAL | Status: AC
  Administered 2017-01-02: 1 mg via ORAL

## 2017-01-02 MED ORDER — FUROSEMIDE 40 MG PO TABS
40.0000 mg | ORAL_TABLET | Freq: Every day | ORAL | Status: DC
  Administered 2017-01-03 – 2017-01-04 (×2): 40 mg via ORAL
  Filled 2017-01-02 (×3): qty 1

## 2017-01-02 NOTE — Progress Notes (Signed)
PROGRESS NOTE    Richard Bird  TOI:[id number] DOB: [birth year] DOA: 12/29/2016 PCP: Chevis Pretty, MD  Brief Narrative:  Richard Bird is a 51 y.o. male with medical history significant for long-standing alcohol dependence who presents the emergency department for evaluation of general weakness, shortness of breath, jaundice, abdominal distention, and bilateral lower extremity edema. Patient reports a long history of daily alcohol use, approximately 5-10 drinks per day and had otherwise been in his usual state of health until approximately 3 weeks ago when he noted the insidious development of right lower extremity swelling. Over the next week or so, he developed swelling in the left leg as well and also noted progressive abdominal distention and yellowing of his skin and eyes. Over the ensuing days, all of these complaints were progressively worsening and the patient was becoming increasingly short of breath with exertion. He denies any fevers or chills, denies chest pain or palpitations, and denies dyspnea while at rest, or cough. There has been no significant nausea, no vomiting, no diarrhea, and no melena or hematochezia. Patient denies any use of illicit substances and denies any history of IV drug abuse. He denies a family history of liver disease. He reports abstaining from alcohol for up to a couple months at a time remotely, but has not gone a day without drinking in many months. He denies history of DTs or seizure, but has noted mild withdrawal symptoms in the past which she has relieved with drinking. Admitted for Decompensated Cirrhosis and found to have a liver Lesion. Gastroenterology was consulter for further evaluation and recommendations for Liver Cirrhosis.   Assessment & Plan:   Principal Problem:   Cirrhosis of liver with ascites (HCC) Active Problems:   Alcoholic cirrhosis of liver with ascites (HCC)   Hyponatremia   Hypokalemia   LFTs abnormal   Coagulopathy (HCC)  Normocytic anemia   Hepatitis, alcoholic, acute   Liver lesion  1. Alcoholic hepatitis/Decompensated cirrhosis  - Pt with long hx of heavy daily alcohol use, now p/w jaundice, ascites, anasarca - Noted to have t bili 17 with alk phos 244, AST 131, and ALT 33, INR 1.40 -Repet TBili now (18.2) along with Alk Phos (183) and AST (106) - CT abd/pelvis reveals cirrhotic liver, evidence for portal HTN, and central hypodense liver lesion - Pt was counseled regarding critical importance of abstinence from alcohol going forward; family at bedside are very supportive - Plan to start acid-suppression, replete lytes, supplement vitamins and minerals, monitor with CIWA and prn Ativan - US-guided paracentesis with fluid analysis and done and drained  2.9 Liters of golden fluid that was sent for Analysis -Initial Concern for SBP but WBC was 91 so will Stopped Abx - Maddrey discriminant function score is 39 on admission and was 26.5 yesterday's blood work -MELD Score 25 - Discriminant Function Score (My calculation it was 42 and GI's calculation was 30) -Viral Hepatitis panel Negative -Started Patient on Prednisolone 40 mg po Daily and discussed with GI and ok to continue for now -Low Salt Diet Started and Fluid Restrict to 1500 mL -Discontinued Albumin 25 g IV q6h and IV Lasix 20 mg IV q12h;  -Gastroenterology Increased Spironolactone 50 mg qdaily to 100 mg po qdaily and started patient on 40 mg of po Lasix. -Checked Ammonia Level and was 32; C/w Lactulose 20 grams po BID to 30 mg po BID and repeat Ammonia Level in AM -Gastroenterology Consulted for further evaluation of decompensate Liver Cirrhosis and appreciate Recc's -Dr. Paulita Fujita recommending non-essential  IV Fluids -Will need SNF at D/C and Recommend Inpatient Alcoholic Rehabitlitation  2. Liver lesion  - 2.4 cm hypodense lesion noted in central liver on CT -MRI showed Segment 8 right liver lobe 1.8 cm liver lesion, considered LI-RADS category 3  (intermediate probability for [facility]).  -Follow-up MRI abdomen without and with IV contrast recommended in 3-6 months. -AFP 3.2 and repeat was 3.0  3. Hyponatremia, improved - Serum sodium 131 on admission, and repeat was 135 - Secondary to cirrhosis with reduced effective arterial blood volume - Repeat chem panel in AM   4. Hypokalemia  - Serum potassium is 3.4 on admission and was 3.5 this AM -C/w KCl 40 mEQ BID;  -Repeat CMP  5. Coagulapathy  - INR 1.40 on admission without any apparent bleeding and repeat was 1.52;  - Likely secondary to liver disease; will monitor   6. Anemia,Thrombocytopenia  - Hgb is 8.4 on admission and platelets 127k; Now 8.5 and Platelets 139 - There is no apparent bleeding, and no pallor  - Likely secondary to alcohol abuse and liver disease - Type and screen performed; anemia panel sent  - Anemia Panel shows Iron Level of 30, TIBC of 246, Sat Ratio of 12, and Ferritin Level of 73 -Repeat CBC in AM  7. Anasarca  - Pt presents with anasarca secondary to decompensated cirrhosis  -Paracentesis as above  - Albumin only 2.9 and Albumin will be given q6h with Lasix 20 mg IV q12h  - Gastroenterology started patient on Spironolactone 50 mg po qdaily - Follow daily wts and I/O's  - RLE much larger than left and venous US done and is Negative for DVT in the Right lower extremity and no Bakers Cyst -Echo to r/o Heart Failure Component -> ECHO showed EF of 60-65% with normal wall motion abnormalities and LV Diastolic Fxn Parameters were normal   8. Leukocytosis -WBC went from 12.6 -> 12.4 -> 13.6 -> 13.6 -> 15.2 -No Source of Infection Identified; Fluid Analysis sent  -Was concerned for SBP but Fluid Analysis showed 91 WBC so Abx will be stopped -Blood Cx's Ordered by Gastroenterology and showed NGTD <24 hours -Patient now on Prednisolone 40 mg po Daily -Repeat CBC in AM  9. Hyperammonemia, improved -Ammonia Level was 58 yesterday and improved to 32 -C/w  Lactulose 30 mg po BID -Repeat Ammonia Level in AM  10. Hypomagnesemia, improved -Patient's Mag Level was 1.9 -Replete as necessary -Repeat Mag Level in AM  DVT prophylaxis: Lovenox 40 mg sq Code Status: FULL CODE Family Communication: Discussed with Children bedside Disposition Plan: SNF when medically stable  Consultants:   Interventional Radiology  Gastroenterology   Procedures: Paracentesis  Antimicrobials: Anti-infectives    Start     Dose/Rate Route Frequency Ordered Stop   12/31/16 1100  cefTRIAXone (ROCEPHIN) 2 g in dextrose 5 % 50 mL IVPB  Status:  Discontinued     2 g 100 mL/hr over 30 Minutes Intravenous Every 24 hours 12/31/16 1050 12/31/16 1522       Subjective: Seen and examined and was doing better than yesterday. No nausea or vomiting. No CP or SOB. Thinks he is less swollen and noticed left leg is better but right leg still with some swelling.    Objective: Vitals:   01/01/17 1325 01/01/17 2000 01/02/17 0500 01/02/17 0540  BP: 133/79 (!) 146/81  (!) 154/96  Pulse: (!) 103 (!) 101  93  Resp:  19  18  Temp: 98.4 F (36.9 C) 97.6 F (  36.4 C)  98 F (36.7 C)  TempSrc:  Oral  Oral  SpO2: 97% 98%  97%  Weight:   97.6 kg (215 lb 2.7 oz)   Height:        Intake/Output Summary (Last 24 hours) at 01/02/17 1321 Last data filed at 01/02/17 0606  Gross per 24 hour  Intake              840 ml  Output             2000 ml  Net            -1160 ml   Filed Weights   12/31/16 0634 01/01/17 0626 01/02/17 0500  Weight: 100.9 kg (222 lb 7.1 oz) 97.8 kg (215 lb 9.8 oz) 97.6 kg (215 lb 2.7 oz)   Examination: Physical Exam:  Constitutional: NAD patient with severe Jaundice Eyes: Lids normal, scleral icterus noted  ENMT: External Ears, Nose appear normal. Grossly normal hearing.  Neck: Appears normal, supple, no cervical masses, normal ROM, no appreciable thyromegaly Respiratory: Diminished to auscultation bilaterally, no wheezing, rales, rhonchi or  crackles. Normal respiratory effort and patient is not tachypenic. No accessory muscle use.  Cardiovascular: RRR, no appreciable murmurs / rubs / gallops. S1 and S2 auscultated. 1+ Bilateral Edema with Right worse than left Abdomen: Soft, non-tender, distended with fluid wave shift. No masses palpated. Bowel sounds positive x4.  GU: Deferred. Musculoskeletal: No clubbing / cyanosis of digits/nails. No joint deformity upper and lower extremities. Good ROM, no contractures. Skin: Yellowish hue of skin, No rashes, lesions or ulcers noted. Neurologic: CN 2-12 grossly intact with no focal deficits. No Asterixis noted but still had slight tremors Psychiatric: Normal judgment and insight. Normal mood and appropriate affect.   Data Reviewed: I have personally reviewed following labs and imaging studies  CBC:  Recent Labs Lab 12/29/16 1940 12/30/16 0144 12/31/16 0600 01/01/17 0527 01/02/17 0459  WBC 12.6* 12.4* 13.6* 13.6* 15.2*  NEUTROABS 10.8* 11.2* 11.5* 11.6* 13.8*  HGB 8.4* 7.4* 8.0* 8.0* 8.5*  HCT 25.0* 21.8* 24.3* 24.1* 25.7*  MCV 96.9 98.6 98.0 99.2 100.8*  PLT 127* 109* 116* PLATELET CLUMPS NOTED ON SMEAR, UNABLE TO ESTIMATE 115*   Basic Metabolic Panel:  Recent Labs Lab 12/29/16 1940 12/30/16 0144 12/31/16 0600 01/01/17 0527 01/02/17 0459  NA 131* 131* 135 135 135  K 3.4* 3.3* 3.4* 3.5 3.4*  CL 97* 98* 100* 99* 99*  CO2 23 21* 23 23 25   GLUCOSE 107* 105* 94 89 111*  BUN 22* 22* 18 17 17   CREATININE 1.14 1.01 0.85 0.56* 0.74  CALCIUM 8.1* 7.9* 8.4* 8.3* 8.5*  MG  --  1.7 1.5* 1.5* 1.9  PHOS  --   --  3.0 3.2 4.7*   GFR: Estimated Creatinine Clearance: 127.2 mL/min (by C-G formula based on SCr of 0.74 mg/dL). Liver Function Tests:  Recent Labs Lab 12/29/16 1940 12/30/16 0144 12/31/16 0600 01/01/17 0527 01/02/17 0459  AST 131* 129* 125* 117* 106*  ALT 33 31 33 33 34  ALKPHOS 244* 224* 218* 188* 183*  BILITOT 17.2* 16.8* 18.0* 18.5* 18.2*  PROT 7.1 7.2 7.2  7.0 7.1  ALBUMIN 2.4* 2.5* 2.9* 2.7* 2.5*   No results for input(s): LIPASE, AMYLASE in the last 168 hours.  Recent Labs Lab 12/31/16 0600 01/01/17 0527 01/02/17 0459  AMMONIA 57* 58* 32   Coagulation Profile:  Recent Labs Lab 12/29/16 1940 12/30/16 0144 01/01/17 0527 01/02/17 0459  INR 1.40 1.41 1.46 1.52  Cardiac Enzymes:  Recent Labs Lab 12/29/16 1940  TROPONINI 0.03*   BNP (last 3 results) No results for input(s): PROBNP in the last 8760 hours. HbA1C: No results for input(s): HGBA1C in the last 72 hours. CBG:  Recent Labs Lab 12/31/16 0748 01/01/17 0756 01/02/17 0737  GLUCAP 92 92 102*   Lipid Profile: No results for input(s): CHOL, HDL, LDLCALC, TRIG, CHOLHDL, LDLDIRECT in the last 72 hours. Thyroid Function Tests: No results for input(s): TSH, T4TOTAL, FREET4, T3FREE, THYROIDAB in the last 72 hours. Anemia Panel: No results for input(s): VITAMINB12, FOLATE, FERRITIN, TIBC, IRON, RETICCTPCT in the last 72 hours. Sepsis Labs: No results for input(s): PROCALCITON, LATICACIDVEN in the last 168 hours.  Recent Results (from the past 240 hour(s))  Body fluid culture     Status: None (Preliminary result)   Collection Time: 12/30/16  2:49 PM  Result Value Ref Range Status   Specimen Description Peritoneal  Final   Special Requests NONE  Final   Gram Stain   Final    FEW WBC PRESENT, PREDOMINANTLY MONONUCLEAR NO ORGANISMS SEEN    Culture   Final    NO GROWTH 3 DAYS Performed at [facility], [address], [place], [place] [address]    Report Status PENDING  Incomplete  Culture, blood (routine x 2)     Status: None (Preliminary result)   Collection Time: 12/31/16  2:51 PM  Result Value Ref Range Status   Specimen Description BLOOD LEFT ANTECUBITAL  Final   Special Requests   Final    BOTTLES DRAWN AEROBIC AND ANAEROBIC Blood Culture adequate volume   Culture   Final    NO GROWTH < 24 HOURS Performed at [facility], [place] [address] [place], [place] [address]    Report Status PENDING  Incomplete  Culture, blood (routine x 2)     Status: None (Preliminary result)   Collection Time: 12/31/16  2:57 PM  Result Value Ref Range Status   Specimen Description BLOOD LEFT HAND  Final   Special Requests   Final    BOTTLES DRAWN AEROBIC AND ANAEROBIC Blood Culture adequate volume   Culture   Final    NO GROWTH < 24 HOURS Performed at [facility], [place] [address] [place], [place] [address]    Report Status PENDING  Incomplete    Radiology Studies: No results found. Scheduled Meds: . enoxaparin (LOVENOX) injection  40 mg Subcutaneous QHS  . famotidine  20 mg Oral BID  . folic acid  1 mg Oral Daily  . furosemide  40 mg Oral Daily  . lactulose  30 g Oral BID  . LORazepam  0-4 mg Oral Q12H  . multivitamin with minerals  1 tablet Oral Daily  . pantoprazole  40 mg Oral Daily  . potassium chloride  40 mEq Oral BID  . prednisoLONE  40 mg Oral Daily  . sodium chloride flush  3 mL Intravenous Q12H  . [START ON 01/03/2017] spironolactone  100 mg Oral Daily  . thiamine  100 mg Oral Daily   Continuous Infusions:   LOS: 4 days   Kerney Elbe, DO [facility] Pager [phone]  If 7PM-7AM, please contact night-coverage [email or web address] Password [email or web address] 01/02/2017, 1:21 PM

## 2017-01-02 NOTE — Progress Notes (Signed)
[id number]-Talmadge- Patient on q6 CIWAA scored 5 at 0600 and calls for  of ativan, however ativan is scheduled q12 no prn order. Can you assist?    MD [facility] paged regarding.

## 2017-01-02 NOTE — Progress Notes (Signed)
Subjective: No complaints.  Objective: Vital signs in last 24 hours: Temp:  [97.6 F (36.4 C)-98.4 F (36.9 C)] 98 F (36.7 C) (04/15 0540) Pulse Rate:  [93-103] 93 (04/15 0540) Resp:  [18-19] 18 (04/15 0540) BP: (133-154)/(79-96) 154/96 (04/15 0540) SpO2:  [97 %-98 %] 97 % (04/15 0540) Weight:  [97.6 kg (215 lb 2.7 oz)] 97.6 kg (215 lb 2.7 oz) (04/15 0500) Weight change: -0.2 kg (-7.1 oz) Last BM Date: 01/01/17  PE: GEN:  Overweight, edematous, jaundiced, older-appearing than stated age ABD:  Moderate ascites, small reducible umbilical hernia, non-tender EXT:  2+ pitting edema bilateral lower extremities  Lab Results: CBC    Component Value Date/Time   WBC 15.2 (H) 01/02/2017 0459   RBC 2.55 (L) 01/02/2017 0459   HGB 8.5 (L) 01/02/2017 0459   HCT 25.7 (L) 01/02/2017 0459   PLT 139 (L) 01/02/2017 0459   MCV 100.8 (H) 01/02/2017 0459   MCH 33.3 01/02/2017 0459   MCHC 33.1 01/02/2017 0459   RDW 17.9 (H) 01/02/2017 0459   LYMPHSABS 0.8 01/02/2017 0459   MONOABS 0.5 01/02/2017 0459   EOSABS 0.1 01/02/2017 0459   BASOSABS 0.0 01/02/2017 0459   CMP     Component Value Date/Time   NA 135 01/02/2017 0459   K 3.4 (L) 01/02/2017 0459   CL 99 (L) 01/02/2017 0459   CO2 25 01/02/2017 0459   GLUCOSE 111 (H) 01/02/2017 0459   BUN 17 01/02/2017 0459   CREATININE 0.74 01/02/2017 0459   CALCIUM 8.5 (L) 01/02/2017 0459   PROT 7.1 01/02/2017 0459   ALBUMIN 2.5 (L) 01/02/2017 0459   AST 106 (H) 01/02/2017 0459   ALT 34 01/02/2017 0459   ALKPHOS 183 (H) 01/02/2017 0459   BILITOT 18.2 (HH) 01/02/2017 0459   GFRNONAA >60 01/02/2017 0459   GFRAA >60 01/02/2017 0459   Assessment:  1.  Alcoholic hepatitis. 2.  Alcohol-mediated cirrhosis. 3.  Volume overload (edema, ascites).  SAAG > 1.1.  No SBP on paracentesis. 4.  Alcohol abuse. 5.  Liver lesion on imaging, but normal AFP.  MRI indeterminate for small HCC lesion.  Plan:  1.  Low sodium diet. 2.  Minimize any  non-essential IV fluids. 3.  Stop albumin with diuretics. 4.  Change furosemide to 40 mg po qd. 5.  Increase spironolactone to 100 mg po qd. 6.  Follow LFTs, PT/INR and renal function. 7.  Anticipated transition to skilled nursing next week; after this is complete, I would strongly recommend INPATIENT alcohol rehabilitation. 8.  Repeat MRI liver in 4-6 months. 9.  [facility] will follow.   Richard Bird 01/02/2017, 1:10 PM   Pager [phone] If no answer or after 5 PM call [phone]

## 2017-01-02 NOTE — Progress Notes (Signed)
  Echocardiogram 2D Echocardiogram has been performed.  Arvil Chaco 01/02/2017, 9:29 AM

## 2017-01-03 LAB — CBC WITH DIFFERENTIAL/PLATELET
BASOS ABS: 0 10*3/uL (ref 0.0–0.1)
Basophils Relative: 0 %
Eosinophils Absolute: 0.1 10*3/uL (ref 0.0–0.7)
Eosinophils Relative: 1 %
HCT: 26.3 % — ABNORMAL LOW (ref 39.0–52.0)
Hemoglobin: 8.7 g/dL — ABNORMAL LOW (ref 13.0–17.0)
LYMPHS PCT: 7 %
Lymphs Abs: 1.1 10*3/uL (ref 0.7–4.0)
MCH: 33.9 pg (ref 26.0–34.0)
MCHC: 33.1 g/dL (ref 30.0–36.0)
MCV: 102.3 fL — AB (ref 78.0–100.0)
MONO ABS: 0.4 10*3/uL (ref 0.1–1.0)
Monocytes Relative: 3 %
Neutro Abs: 15.4 10*3/uL — ABNORMAL HIGH (ref 1.7–7.7)
Neutrophils Relative %: 89 %
PLATELETS: 165 10*3/uL (ref 150–400)
RBC: 2.57 MIL/uL — AB (ref 4.22–5.81)
RDW: 18 % — AB (ref 11.5–15.5)
WBC: 17.1 10*3/uL — AB (ref 4.0–10.5)

## 2017-01-03 LAB — COMPREHENSIVE METABOLIC PANEL
ALBUMIN: 2.7 g/dL — AB (ref 3.5–5.0)
ALT: 40 U/L (ref 17–63)
AST: 119 U/L — AB (ref 15–41)
Alkaline Phosphatase: 210 U/L — ABNORMAL HIGH (ref 38–126)
Anion gap: 11 (ref 5–15)
BUN: 19 mg/dL (ref 6–20)
CHLORIDE: 99 mmol/L — AB (ref 101–111)
CO2: 25 mmol/L (ref 22–32)
CREATININE: 0.73 mg/dL (ref 0.61–1.24)
Calcium: 8.6 mg/dL — ABNORMAL LOW (ref 8.9–10.3)
GFR calc Af Amer: >60 mL/min (ref >60)
GFR calc non Af Amer: >60 mL/min (ref >60)
GLUCOSE: 104 mg/dL — AB (ref 65–99)
POTASSIUM: 3.8 mmol/L (ref 3.5–5.1)
SODIUM: 135 mmol/L (ref 135–145)
Total Bilirubin: 17.3 mg/dL — ABNORMAL HIGH (ref 0.3–1.2)
Total Protein: 7.2 g/dL (ref 6.5–8.1)

## 2017-01-03 LAB — BODY FLUID CULTURE: CULTURE: NO GROWTH

## 2017-01-03 LAB — MAGNESIUM: MAGNESIUM: 1.8 mg/dL (ref 1.7–2.4)

## 2017-01-03 LAB — GLUCOSE, CAPILLARY: Glucose-Capillary: 85 mg/dL (ref 65–99)

## 2017-01-03 LAB — PROTIME-INR
INR: 1.54
Prothrombin Time: 18.6 seconds — ABNORMAL HIGH (ref 11.4–15.2)

## 2017-01-03 LAB — AMMONIA: Ammonia: 40 umol/L — ABNORMAL HIGH (ref 9–35)

## 2017-01-03 LAB — PHOSPHORUS: PHOSPHORUS: 4.2 mg/dL (ref 2.5–4.6)

## 2017-01-03 MED ORDER — POTASSIUM CHLORIDE CRYS ER 20 MEQ PO TBCR
20.0000 meq | EXTENDED_RELEASE_TABLET | Freq: Two times a day (BID) | ORAL | Status: DC
  Administered 2017-01-03 – 2017-01-05 (×3): 20 meq via ORAL
  Filled 2017-01-03 (×4): qty 1

## 2017-01-03 NOTE — NC FL2 (Signed)
[place] MEDICAID FL2 LEVEL OF CARE SCREENING TOOL     IDENTIFICATION  Patient Name: Richard Bird Birthdate: [birth year] Sex: male Admission Date (Current Location): 12/29/2016  [facility] and [place] Number:  Producer, television/film/video and Address:  [facility],  [address] [place], [place] [address]      Provider Number: [id number]  Attending Physician Name and Address:  Merlene Laughter, DO  Relative Name and Phone Number:       Current Level of Care: Hospital Recommended Level of Care: Skilled Nursing Facility Prior Approval Number:    Date Approved/Denied:   PASRR Number:   [id number]   Discharge Plan: SNF    Current Diagnoses: Patient Active Problem List   Diagnosis Date Noted  . Liver lesion   . Alcoholic cirrhosis of liver with ascites (HCC) 12/29/2016  . Hyponatremia 12/29/2016  . Hypokalemia 12/29/2016  . LFTs abnormal 12/29/2016  . Coagulopathy (HCC) 12/29/2016  . Normocytic anemia 12/29/2016  . Cirrhosis of liver with ascites (HCC) 12/29/2016  . Hepatitis, alcoholic, acute 12/29/2016    Orientation RESPIRATION BLADDER Height & Weight     Self, Time, Situation, Place  Normal Incontinent Weight: 211 lb 6.7 oz (95.9 kg) Height:   (180.3 cm)  BEHAVIORAL SYMPTOMS/MOOD NEUROLOGICAL BOWEL NUTRITION STATUS      Continent Diet (2 gram sodium)  AMBULATORY STATUS COMMUNICATION OF NEEDS Skin   Limited Assist Verbally Normal                       Personal Care Assistance Level of Assistance  Bathing, Dressing, Feeding Bathing Assistance: Limited assistance Feeding assistance: Independent Dressing Assistance: Limited assistance     Functional Limitations Info             SPECIAL CARE FACTORS FREQUENCY  PT (By licensed PT), OT (By licensed OT)     PT Frequency: 5 OT Frequency: 5            Contractures      Additional Factors Info  Allergies, Code Status Code Status Info: Full code Allergies Info: PENICILLINS           Current Medications (01/03/2017):  This is the current hospital active medication list Current Facility-Administered Medications  Medication Dose Route Frequency Provider Last Rate Last Dose  . acetaminophen (TYLENOL) tablet 650 mg  650 mg Oral Q6H PRN Briscoe Deutscher, MD       Or  . acetaminophen (TYLENOL) suppository 650 mg  650 mg Rectal Q6H PRN Briscoe Deutscher, MD      . bisacodyl (DULCOLAX) EC tablet 5 mg  5 mg Oral Daily PRN Lavone Neri Opyd, MD      . enoxaparin (LOVENOX) injection 40 mg  40 mg Subcutaneous QHS Briscoe Deutscher, MD   40 mg at 01/02/17 2322  . famotidine (PEPCID) tablet 20 mg  20 mg Oral BID Briscoe Deutscher, MD   20 mg at 01/02/17 2322  . folic acid (FOLVITE) tablet 1 mg  1 mg Oral Daily Briscoe Deutscher, MD   1 mg at 01/02/17 1108  . furosemide (LASIX) tablet 40 mg  40 mg Oral Daily Willis Modena, MD      . lactulose (CHRONULAC) 10 GM/15ML solution 30 g  30 g Oral BID Heloise Beecham Sheikh, DO   30 g at 01/02/17 2322  . multivitamin with minerals tablet 1 tablet  1 tablet Oral Daily Briscoe Deutscher, MD   1 tablet at 01/02/17  1109  . ondansetron (ZOFRAN) tablet 4 mg  4 mg Oral Q6H PRN Briscoe Deutscher, MD       Or  . ondansetron (ZOFRAN) injection 4 mg  4 mg Intravenous Q6H PRN Lavone Neri Opyd, MD      . oxyCODONE (Oxy IR/ROXICODONE) immediate release tablet 5-10 mg  5-10 mg Oral Q4H PRN Briscoe Deutscher, MD      . pantoprazole (PROTONIX) EC tablet 40 mg  40 mg Oral Daily [employer], DO   40 mg at 01/02/17 1109  . polyethylene glycol (MIRALAX / GLYCOLAX) packet 17 g  17 g Oral Daily PRN Lavone Neri Opyd, MD      . potassium chloride SA (K-DUR,KLOR-CON) CR tablet 40 mEq  40 mEq Oral BID Heloise Beecham Sheikh, DO   40 mEq at 01/02/17 2322  . prednisoLONE tablet 40 mg  40 mg Oral Daily [facility], DO   40 mg at 01/02/17 1247  . sodium chloride flush (NS) 0.9 % injection 3 mL  3 mL Intravenous Q12H Lavone Neri Opyd, MD   3 mL at 01/02/17 2333  . spironolactone (ALDACTONE)  tablet 100 mg  100 mg Oral Daily Willis Modena, MD      . thiamine (VITAMIN B-1) tablet 100 mg  100 mg Oral Daily Briscoe Deutscher, MD   100 mg at 01/02/17 1108     Discharge Medications: Please see discharge summary for a list of discharge medications.  Relevant Imaging Results:  Relevant Lab Results:   Additional Information SS#: [id number]  Donnie Coffin, LCSW

## 2017-01-03 NOTE — Progress Notes (Signed)
Provided initial offers to patient- no preference at this time- will discuss with family.  Burna Sis, LCSW Clinical Social Worker [phone]

## 2017-01-03 NOTE — Progress Notes (Signed)
Rehab Admissions Coordinator Note:  Patient was screened by Trish Mage for appropriateness for an Inpatient Acute Rehab Consult.  At this time, we are recommending Skilled Nursing Facility.  Trish Mage 01/03/2017, 2:27 PM  I can be reached at [phone].

## 2017-01-03 NOTE — Progress Notes (Signed)
Physical Therapy Treatment Patient Details Name: Richard Bird MRN: [id number] DOB: Oct 16, 1965 Today's Date: 01/03/2017    History of Present Illness Richard Bird is a 51 y.o. male with medical history significant for long-standing alcohol dependence who presents the emergency department for evaluation of general weakness, shortness of breath, jaundice, abdominal distention, and bilateral lower extremity edema    PT Comments    The patient is much improved in mobility today but does demonstrate ataxic gait and imbalance requiring assistance to safely ambulate.   The son is present  And both are  interested in patient  Going to [facility] . Continue PT while in acute care.  Follow Up Recommendations  SNF;Supervision/Assistance - 24 hour     Equipment Recommendations    RW  Recommendations for Other Services  OT if DC home     Precautions / Restrictions Precautions Precautions: Fall Restrictions Weight Bearing Restrictions: No    Mobility  Bed Mobility   Bed Mobility: Supine to Sit     Supine to sit: Min guard     General bed mobility comments: incr time, , noted jerking tremors of arms and trunk, aand legs  Transfers Overall transfer level: Needs assistance Equipment used: Rolling walker (2 wheeled) Transfers: Sit to/from Stand Sit to Stand: Min assist         General transfer comment: steady assist to  stand at the RW, noted ataxic like jerking movements of the trunk and limbs.  Ambulation/Gait Ambulation/Gait assistance: Min assist;Mod assist;+2 safety/equipment Ambulation Distance (Feet): 200 Feet (then 200' without the RW ) Assistive device: Rolling walker (2 wheeled);1 person hand held assist Gait Pattern/deviations: Step-to pattern;Staggering left;Staggering right;Ataxic;Scissoring     General Gait Details: the patient is very unsteady  with and without AD, Ataxia noted. Requires assistance at times for imbalance, especially when No AD is used.     Stairs            Wheelchair Mobility    Modified Rankin (Stroke Patients Only)       Balance Overall balance assessment: (P) Needs assistance   Sitting balance-Leahy Scale: (P) Fair     Standing balance support: (P) During functional activity;No upper extremity supported Standing balance-Leahy Scale: (P) Poor                              Cognition Arousal/Alertness: Awake/alert Behavior During Therapy: WFL for tasks assessed/performed Overall Cognitive Status: Impaired/Different from baseline Area of Impairment: Following commands;Awareness;Safety/judgement                       Following Commands: Follows one step commands with increased time Safety/Judgement: Decreased awareness of deficits;Decreased awareness of safety            Exercises      General Comments        Pertinent Vitals/Pain Pain Assessment: No/denies pain    Home Living                      Prior Function            PT Goals (current goals can now be found in the care plan section) Progress towards PT goals: Progressing toward goals    Frequency    Min 3X/week      PT Plan Current plan remains appropriate    Co-evaluation             End of Session Equipment  Utilized During Treatment: Gait belt Activity Tolerance: Patient tolerated treatment well Patient left: in chair;with call bell/phone within reach;with chair alarm set;with family/visitor present Nurse Communication: Mobility status PT Visit Diagnosis: Ataxic gait (R26.0);Unsteadiness on feet (R26.81);Other abnormalities of gait and mobility (R26.89)     Time: 1610-9604 PT Time Calculation (min) (ACUTE ONLY): 16 min  Charges:  $Gait Training: 8-22 mins                    G CodesBlanchard Kelch PT [phone]   Rada Hay 01/03/2017, 1:10 PM

## 2017-01-03 NOTE — Clinical Social Work Note (Signed)
Clinical Social Work Assessment  Patient Details  Name: Richard Bird MRN: [id number] Date of Birth: 10-24-1965  Date of referral:  01/03/17               Reason for consult:  Facility Placement                Permission sought to share information with:  Facility Art therapist granted to share information::  Yes, Verbal Permission Granted  Name::        Agency::  SNF  Relationship::  son  Contact Information:     Housing/Transportation Living arrangements for the past 2 months:  Apartment Source of Information:  Patient, Adult Children Patient Interpreter Needed:  None Criminal Activity/Legal Involvement Pertinent to Current Situation/Hospitalization:  No - Comment as needed Significant Relationships:  Adult Children Lives with:  Self Do you feel safe going back to the place where you live?  No Need for family participation in patient care:  No (Coment)  Care giving concerns: Pt lives at home alone- drinks heavily and is currently very weak due to medical concerns.  Pt has 2 adult children who live out of town and would be unable to provide assistance at DC.   Social Worker assessment / plan:  CSW met with pt and pt son at bedside to discuss SNF.  Patient and son are interested in patient going to short term physical therapy rehab prior to return home or to inpatient alcohol rehab program.  CSW explained SNF and SNF referral process as well approximate cost since patient is uninsured.  Employment status:  [employer] information:  Self Pay (Medicaid Pending) PT Recommendations:  [place] / Referral to community resources:  [place]  Patient/Family's Response to care:  Pt and son agreeable to self-pay at [facility] for short term rehab- express understanding that payment for SNF would be requested upfront.    Patient/Family's Understanding of and Emotional Response to Diagnosis, Current Treatment, and Prognosis:   Patient expresses good understanding of his condition at this time and acknowledges his need for rehab prior to return home- hopeful that he can regain independence and move on to alcohol rehab programs.  Emotional Assessment Appearance:  Appears stated age Attitude/Demeanor/Rapport:    Affect (typically observed):  Appropriate, Pleasant Orientation:  Oriented to Self, Oriented to Place, Oriented to  Time, Oriented to Situation Alcohol / Substance use:  Alcohol Use Psych involvement (Current and /or in the community):  No (Comment)  Discharge Needs  Concerns to be addressed:  Care Coordination Readmission within the last 30 days:  No Current discharge risk:  Physical Impairment, Lives alone, Substance Abuse Barriers to Discharge:  Continued Medical Work up   Richard Ny, LCSW 01/03/2017, 1:07 PM

## 2017-01-03 NOTE — Progress Notes (Signed)
Subjective: No complaints.  Objective: Vital signs in last 24 hours: Temp:  [97.6 F (36.4 C)-98.2 F (36.8 C)] 98 F (36.7 C) (04/16 1447) Pulse Rate:  [84-92] 88 (04/16 1447) Resp:  [16-18] 16 (04/16 1447) BP: (133-135)/(78-85) 135/82 (04/16 1447) SpO2:  [97 %-99 %] 99 % (04/16 1447) Weight:  [95.9 kg (211 lb 6.7 oz)] 95.9 kg (211 lb 6.7 oz) (04/16 0432) Weight change: -1.7 kg (-3 lb 12 oz) Last BM Date: 01/02/17  PE: GEN:  NAD, jaundiced SKIN/HEENT:  Jaundiced, scleral icterus EXT:  2+ edema ABD:  Moderate ascites  Lab Results: CBC    Component Value Date/Time   WBC 17.1 (H) 01/03/2017 0456   RBC 2.57 (L) 01/03/2017 0456   HGB 8.7 (L) 01/03/2017 0456   HCT 26.3 (L) 01/03/2017 0456   PLT 165 01/03/2017 0456   MCV 102.3 (H) 01/03/2017 0456   MCH 33.9 01/03/2017 0456   MCHC 33.1 01/03/2017 0456   RDW 18.0 (H) 01/03/2017 0456   LYMPHSABS 1.1 01/03/2017 0456   MONOABS 0.4 01/03/2017 0456   EOSABS 0.1 01/03/2017 0456   BASOSABS 0.0 01/03/2017 0456   CMP     Component Value Date/Time   NA 135 01/03/2017 0456   K 3.8 01/03/2017 0456   CL 99 (L) 01/03/2017 0456   CO2 25 01/03/2017 0456   GLUCOSE 104 (H) 01/03/2017 0456   BUN 19 01/03/2017 0456   CREATININE 0.73 01/03/2017 0456   CALCIUM 8.6 (L) 01/03/2017 0456   PROT 7.2 01/03/2017 0456   ALBUMIN 2.7 (L) 01/03/2017 0456   AST 119 (H) 01/03/2017 0456   ALT 40 01/03/2017 0456   ALKPHOS 210 (H) 01/03/2017 0456   BILITOT 17.3 (H) 01/03/2017 0456   GFRNONAA >60 01/03/2017 0456   GFRAA >60 01/03/2017 0456   Assessment:  1. Alcoholic hepatitis. 2. Alcohol-mediated cirrhosis. 3. Volume overload (edema, ascites). SAAG >1.1. No SBP on paracentesis. 4. Alcohol abuse. 5. Liver lesion on imaging, but normal AFP.  MRI indeterminate for small HCC lesion.  Plan:  1.  Low sodium diet. 2.  Continue furosemide 40 mg po qd and spironolactone 100 mg po qd. 3.  Prednisolone 40 mg po qd x 6 weeks, then 30 mg po qd  x 1 week, then 20 mg po qd x 1 week, then 10 mg po qd x 1 week, then d/c. 4.  PT rehab, then once physical conditioning is better will need INPATIENT alcohol rehab. 5.  Check LFTs and PT/INR once-a-week while in PT rehab. 6.  Needs outpatient GI follow-up in 4-6 weeks. 7.  Will sign-off; please call with questions; thank you for the consult.   Richard Bird 01/03/2017, 3:12 PM   Pager [phone] If no answer or after 5 PM call [phone]

## 2017-01-03 NOTE — Progress Notes (Addendum)
PROGRESS NOTE    Richard Bird  WRU:[id number] DOB: 09-Apr-1966 DOA: 12/29/2016 PCP: Chevis Pretty, MD  Brief Narrative:  Richard Bird is a 51 y.o. male with medical history significant for long-standing alcohol dependence who presents the emergency department for evaluation of general weakness, shortness of breath, jaundice, abdominal distention, and bilateral lower extremity edema. Patient reports a long history of daily alcohol use, approximately 5-10 drinks per day and had otherwise been in his usual state of health until approximately 3 weeks ago when he noted the insidious development of right lower extremity swelling. Over the next week or so, he developed swelling in the left leg as well and also noted progressive abdominal distention and yellowing of his skin and eyes. Over the ensuing days, all of these complaints were progressively worsening and the patient was becoming increasingly short of breath with exertion. He denies any fevers or chills, denies chest pain or palpitations, and denies dyspnea while at rest, or cough. There has been no significant nausea, no vomiting, no diarrhea, and no melena or hematochezia. Patient denies any use of illicit substances and denies any history of IV drug abuse. He denies a family history of liver disease. He reports abstaining from alcohol for up to a couple months at a time remotely, but has not gone a day without drinking in many months. He denies history of DTs or seizure, but has noted mild withdrawal symptoms in the past which she has relieved with drinking. Admitted for Decompensated Cirrhosis and found to have a liver Lesion. Gastroenterology was consulter for further evaluation and recommendations for Liver Cirrhosis. Plan to get patient to SNF and Social worker consulted for assistance in placement.   Assessment & Plan:   Principal Problem:   Cirrhosis of liver with ascites (HCC) Active Problems:   Alcoholic cirrhosis of liver with  ascites (HCC)   Hyponatremia   Hypokalemia   LFTs abnormal   Coagulopathy (HCC)   Normocytic anemia   Hepatitis, alcoholic, acute   Liver lesion  1. Alcoholic hepatitis/Decompensated cirrhosis - Pt with long hx of heavy daily alcohol use, now p/w jaundice, ascites, anasarca - Noted to have t bili 17 with alk phos 244, AST 131, and ALT 33, INR 1.40 on Admission -Repet TBili now (17.3) along with Alk Phos (210) and AST (119) - CT abd/pelvis reveals cirrhotic liver, evidence for portal HTN, and central hypodense liver lesion - Pt was counseled regarding critical importance of abstinence from alcohol going forward; family at bedside are very supportive - Plan to start acid-suppression, replete lytes, supplement vitamins and minerals, monitor with CIWA and prn Ativan - US-guided paracentesis with fluid analysis and done and drained  2.9 Liters of golden fluid that was sent for Analysis -Initial Concern for SBP but WBC was 91 so Stopped Abx - Maddrey discriminant function score is 39 on admission and was 26.5 yesterday's blood work -MELD Score 25 - Discriminant Function Score (My calculation it was 42 and GI's calculation was 30) -Viral Hepatitis panel Negative -Started Patient on Prednisolone 40 mg po Daily and per GI Recc's will need 40 mg po qd x 6 weeks, then 30 mg po qd x 1 week, then 20 mg po qd x 1 week, then 10 mg po qd x 1 week, then D/C -Low Salt Diet Started and Fluid Restrict to 1500 mL -Discontinued Albumin 25 g IV q6h and IV Lasix 20 mg IV q12h;  -C/w Spironolactone 100 mg po qdaily and 40 mg of po Lasix. -Checked Ammonia  Level and was 40; C/w Lactulose 30 grams po BID and repeat Ammonia Level in AM -Gastroenterology recommending non-essential IV Fluids -Will need SNF at D/C and Recommend Inpatient Alcoholic Rehabitlitation after stay at SNF; Per GI will need to Check LFT's and PT/INR once-a-week at [facility] -Gastroenterology Consulted for further evaluation of decompensate Liver  Cirrhosis and appreciated Recc's, Will need to Follow up with GI Clinic in 4-6 Weeks  2. Liver lesion  - 2.4 cm hypodense lesion noted in central liver on CT -MRI showed Segment 8 right liver lobe 1.8 cm liver lesion, considered LI-RADS category 3 (intermediate probability for [facility]).  -Follow-up MRI abdomen without and with IV contrast recommended in 3-6 months. -AFP 3.2 and repeat was 3.0  3. Hyponatremia, improved and stable - Serum sodium 131 on admission, and repeat was 135 for the last 4 days - Secondary to cirrhosis with reduced effective arterial blood volume - Repeat chem panel in AM   4. Hypokalemia  - Serum potassium is 3.4 on admission and was 3.8 this AM -C/w KCl 20 mEQ po Daily;  -Repeat CMP in AM  5. Coagulapathy  - INR 1.40 on admission without any apparent bleeding and repeat was 1.54;  - Likely secondary to liver disease;  - Continue to monitor   6. Anemia,Thrombocytopenia  - Hgb is 8.4 on admission and platelets 127k; Now 8.7 and Platelets 165 - There is no apparent bleeding, and no pallor  - Likely secondary to alcohol abuse and liver disease - Type and screen performed; anemia panel sent  - Anemia Panel shows Iron Level of 30, TIBC of 246, Sat Ratio of 12, and Ferritin Level of 73 -Repeat CBC in AM  7. Anasarca, improving  - Pt presented with anasarca secondary to decompensated cirrhosis  -Paracentesis as above  -Albumin today was 2.7 - Discontinued Albumin 25 g IV q6h and IV Lasix 20 mg IV q12h;  -C/w Spironolactone 100 mg po qdaily and C/w Lasix 40 mg of po Lasix. - Follow daily wts and I/O's  - RLE much larger than left and venous US done and is Negative for DVT in the Right lower extremity and no Bakers Cyst -Echo to r/o Heart Failure Component -> ECHO showed EF of 60-65% with normal wall motion abnormalities and LV Diastolic Fxn Parameters were normal   8. Leukocytosis -WBC went from 12.6 -> 12.4 -> 13.6 -> 13.6 -> 15.2 -> 17.1 -No Source of  Infection Identified; Fluid Analysis sent  -Was concerned for SBP but Fluid Analysis showed 91 WBC so Abx will be stopped -Blood Cx's Ordered by Gastroenterology and showed NGTD at 2 days -Patient now on Prednisolone 40 mg po Daily so question worsening Leukocytosis being Steroid Demargination -Repeat CBC in AM  9. Hyperammonemia -Ammonia Level was 32 yesterday and now 40 -C/w Lactulose 30 mg po BID -Repeat Ammonia Level in AM  10. Hypomagnesemia, improved -Patient's Mag Level was 1.8 this AM -Replete as necessary -Repeat Mag Level in AM  DVT prophylaxis: Lovenox 40 mg sq Code Status: FULL CODE Family Communication: Discussed with Son at bedside Disposition Plan: SNF when medically stable   Consultants:   Interventional Radiology  Gastroenterology (signed off 01/03/17)   Procedures: Paracentesis  Antimicrobials: Anti-infectives    Start     Dose/Rate Route Frequency Ordered Stop   12/31/16 1100  cefTRIAXone (ROCEPHIN) 2 g in dextrose 5 % 50 mL IVPB  Status:  Discontinued     2 g 100 mL/hr over 30 Minutes Intravenous Every  24 hours 12/31/16 1050 12/31/16 1522       Subjective: Seen and examined and was doing better than yesterday and stated he was feeling grea. No nausea or vomiting. No CP or SOB. Stressed importance of avoiding EtOH.   Objective: Vitals:   01/02/17 1357 01/02/17 2048 01/03/17 0432 01/03/17 1447  BP: 129/75 133/78 134/85 135/82  Pulse: (!) 101 92 84 88  Resp: 19 18 18 16   Temp: 98.3 F (36.8 C) 97.6 F (36.4 C) 98.2 F (36.8 C) 98 F (36.7 C)  TempSrc: Oral Oral Oral Oral  SpO2: 98% 97% 98% 99%  Weight:   95.9 kg (211 lb 6.7 oz)   Height:        Intake/Output Summary (Last 24 hours) at 01/03/17 1449 Last data filed at 01/03/17 1315  Gross per 24 hour  Intake              123 ml  Output              500 ml  Net             -377 ml   Filed Weights   01/01/17 0626 01/02/17 0500 01/03/17 0432  Weight: 97.8 kg (215 lb 9.8 oz) 97.6 kg (215  lb 2.7 oz) 95.9 kg (211 lb 6.7 oz)   Examination: Physical Exam:  Constitutional: NAD patient with severe Jaundice Eyes: Lids normal, scleral icterus noted  ENMT: External Ears, Nose appear normal. Grossly normal hearing.  Neck: Appears normal, supple, no cervical masses, normal ROM, no appreciable thyromegaly Respiratory: Diminished to auscultation bilaterally, no wheezing, rales, rhonchi or crackles. Normal respiratory effort and patient is not tachypenic. No accessory muscle use.  Cardiovascular: RRR, no appreciable murmurs / rubs / gallops. S1 and S2 auscultated. 1+ Bilateral Edema with Right worse than left; Left Ankle swelling is worse than right Abdomen: Soft, non-tender, distended with fluid wave shift. No masses palpated. Bowel sounds positive x4.  GU: Deferred. Musculoskeletal: No clubbing / cyanosis of digits/nails. No joint deformity upper and lower extremities. Good ROM, no contractures. Skin: Yellowish hue of skin, No rashes, lesions or ulcers noted. Neurologic: CN 2-12 grossly intact with no focal deficits. No Asterixis noted but still had slight tremors when trying to eat and move.  Psychiatric: Normal judgment and insight. Normal mood and appropriate affect.   Data Reviewed: I have personally reviewed following labs and imaging studies  CBC:  Recent Labs Lab 12/30/16 0144 12/31/16 0600 01/01/17 0527 01/02/17 0459 01/03/17 0456  WBC 12.4* 13.6* 13.6* 15.2* 17.1*  NEUTROABS 11.2* 11.5* 11.6* 13.8* 15.4*  HGB 7.4* 8.0* 8.0* 8.5* 8.7*  HCT 21.8* 24.3* 24.1* 25.7* 26.3*  MCV 98.6 98.0 99.2 100.8* 102.3*  PLT 109* 116* PLATELET CLUMPS NOTED ON SMEAR, UNABLE TO ESTIMATE 139* 010   Basic Metabolic Panel:  Recent Labs Lab 12/30/16 0144 12/31/16 0600 01/01/17 0527 01/02/17 0459 01/03/17 0456  NA 131* 135 135 135 135  K 3.3* 3.4* 3.5 3.4* 3.8  CL 98* 100* 99* 99* 99*  CO2 21* 23 23 25 25   GLUCOSE 105* 94 89 111* 104*  BUN 22* 18 17 17 19   CREATININE 1.01 0.85  0.56* 0.74 0.73  CALCIUM 7.9* 8.4* 8.3* 8.5* 8.6*  MG 1.7 1.5* 1.5* 1.9 1.8  PHOS  --  3.0 3.2 4.7* 4.2   GFR: Estimated Creatinine Clearance: 126.1 mL/min (by C-G formula based on SCr of 0.73 mg/dL). Liver Function Tests:  Recent Labs Lab 12/30/16 0144 12/31/16 0600 01/01/17 0527  01/02/17 0459 01/03/17 0456  AST 129* 125* 117* 106* 119*  ALT 31 33 33 34 40  ALKPHOS 224* 218* 188* 183* 210*  BILITOT 16.8* 18.0* 18.5* 18.2* 17.3*  PROT 7.2 7.2 7.0 7.1 7.2  ALBUMIN 2.5* 2.9* 2.7* 2.5* 2.7*   No results for input(s): LIPASE, AMYLASE in the last 168 hours.  Recent Labs Lab 12/31/16 0600 01/01/17 0527 01/02/17 0459 01/03/17 0456  AMMONIA 57* 58* 32 40*   Coagulation Profile:  Recent Labs Lab 12/29/16 1940 12/30/16 0144 01/01/17 0527 01/02/17 0459 01/03/17 0456  INR 1.40 1.41 1.46 1.52 1.54   Cardiac Enzymes:  Recent Labs Lab 12/29/16 1940  TROPONINI 0.03*   BNP (last 3 results) No results for input(s): PROBNP in the last 8760 hours. HbA1C: No results for input(s): HGBA1C in the last 72 hours. CBG:  Recent Labs Lab 12/31/16 0748 01/01/17 0756 01/02/17 0737 01/03/17 0811  GLUCAP 92 92 102* 85   Lipid Profile: No results for input(s): CHOL, HDL, LDLCALC, TRIG, CHOLHDL, LDLDIRECT in the last 72 hours. Thyroid Function Tests: No results for input(s): TSH, T4TOTAL, FREET4, T3FREE, THYROIDAB in the last 72 hours. Anemia Panel: No results for input(s): VITAMINB12, FOLATE, FERRITIN, TIBC, IRON, RETICCTPCT in the last 72 hours. Sepsis Labs: No results for input(s): PROCALCITON, LATICACIDVEN in the last 168 hours.  Recent Results (from the past 240 hour(s))  Body fluid culture     Status: None   Collection Time: 12/30/16  2:49 PM  Result Value Ref Range Status   Specimen Description Peritoneal  Final   Special Requests NONE  Final   Gram Stain   Final    FEW WBC PRESENT, PREDOMINANTLY MONONUCLEAR NO ORGANISMS SEEN    Culture   Final    NO GROWTH 3  DAYS Performed at [facility], [address], [place], [place] [address]    Report Status 01/03/2017 FINAL  Final  Culture, blood (routine x 2)     Status: None (Preliminary result)   Collection Time: 12/31/16  2:51 PM  Result Value Ref Range Status   Specimen Description BLOOD LEFT ANTECUBITAL  Final   Special Requests   Final    BOTTLES DRAWN AEROBIC AND ANAEROBIC Blood Culture adequate volume   Culture   Final    NO GROWTH 2 DAYS Performed at [facility], [place] [address] [place], [place] [address]    Report Status PENDING  Incomplete  Culture, blood (routine x 2)     Status: None (Preliminary result)   Collection Time: 12/31/16  2:57 PM  Result Value Ref Range Status   Specimen Description BLOOD LEFT HAND  Final   Special Requests   Final    BOTTLES DRAWN AEROBIC AND ANAEROBIC Blood Culture adequate volume   Culture   Final    NO GROWTH 2 DAYS Performed at [facility], [place] [address], [place], [place] [address]    Report Status PENDING  Incomplete    Radiology Studies: No results found. Scheduled Meds: . enoxaparin (LOVENOX) injection  40 mg Subcutaneous QHS  . famotidine  20 mg Oral BID  . folic acid  1 mg Oral Daily  . furosemide  40 mg Oral Daily  . lactulose  30 g Oral BID  . multivitamin with minerals  1 tablet Oral Daily  . pantoprazole  40 mg Oral Daily  . potassium chloride  40 mEq Oral BID  . prednisoLONE  40 mg Oral Daily  . sodium chloride flush  3 mL Intravenous Q12H  . spironolactone  100 mg Oral Daily  . thiamine  100 mg Oral Daily   Continuous Infusions:   LOS: 5 days   Kerney Elbe, DO [facility] Pager [phone]  If 7PM-7AM, please contact night-coverage [email or web address] Password [facility] 01/03/2017, 2:48 PM

## 2017-01-04 LAB — COMPREHENSIVE METABOLIC PANEL
ALBUMIN: 2.5 g/dL — AB (ref 3.5–5.0)
ALT: 44 U/L (ref 17–63)
AST: 112 U/L — AB (ref 15–41)
Alkaline Phosphatase: 180 U/L — ABNORMAL HIGH (ref 38–126)
Anion gap: 8 (ref 5–15)
BUN: 19 mg/dL (ref 6–20)
CHLORIDE: 102 mmol/L (ref 101–111)
CO2: 25 mmol/L (ref 22–32)
CREATININE: 0.78 mg/dL (ref 0.61–1.24)
Calcium: 8.5 mg/dL — ABNORMAL LOW (ref 8.9–10.3)
GFR calc Af Amer: >60 mL/min (ref >60)
GLUCOSE: 111 mg/dL — AB (ref 65–99)
POTASSIUM: 4.1 mmol/L (ref 3.5–5.1)
SODIUM: 135 mmol/L (ref 135–145)
Total Bilirubin: 14.3 mg/dL — ABNORMAL HIGH (ref 0.3–1.2)
Total Protein: 6.7 g/dL (ref 6.5–8.1)

## 2017-01-04 LAB — CBC WITH DIFFERENTIAL/PLATELET
BASOS ABS: 0 10*3/uL (ref 0.0–0.1)
BASOS PCT: 0 %
EOS PCT: 0 %
Eosinophils Absolute: 0.1 10*3/uL (ref 0.0–0.7)
HCT: 26.2 % — ABNORMAL LOW (ref 39.0–52.0)
Hemoglobin: 8.6 g/dL — ABNORMAL LOW (ref 13.0–17.0)
Lymphocytes Relative: 6 %
Lymphs Abs: 0.8 10*3/uL (ref 0.7–4.0)
MCH: 33 pg (ref 26.0–34.0)
MCHC: 32.8 g/dL (ref 30.0–36.0)
MCV: 100.4 fL — AB (ref 78.0–100.0)
MONO ABS: 0.8 10*3/uL (ref 0.1–1.0)
Monocytes Relative: 6 %
Neutro Abs: 12 10*3/uL — ABNORMAL HIGH (ref 1.7–7.7)
Neutrophils Relative %: 88 %
PLATELETS: 154 10*3/uL (ref 150–400)
RBC: 2.61 MIL/uL — ABNORMAL LOW (ref 4.22–5.81)
RDW: 17.1 % — ABNORMAL HIGH (ref 11.5–15.5)
WBC: 13.7 10*3/uL — ABNORMAL HIGH (ref 4.0–10.5)

## 2017-01-04 LAB — PROTIME-INR
INR: 1.56
Prothrombin Time: 18.9 seconds — ABNORMAL HIGH (ref 11.4–15.2)

## 2017-01-04 LAB — GLUCOSE, CAPILLARY: GLUCOSE-CAPILLARY: 78 mg/dL (ref 65–99)

## 2017-01-04 LAB — MAGNESIUM: Magnesium: 1.8 mg/dL (ref 1.7–2.4)

## 2017-01-04 LAB — AMMONIA: Ammonia: 48 umol/L — ABNORMAL HIGH (ref 9–35)

## 2017-01-04 LAB — PHOSPHORUS: Phosphorus: 3.8 mg/dL (ref 2.5–4.6)

## 2017-01-04 NOTE — Progress Notes (Addendum)
CSW followed up with patient and patient's daughter at bedside regarding SNF selection. Patient's daughter requested that CSW contact [facility] SN[facility] to inquire about bed availability for patient. CSW contacted [facility] and spoke with admissions staff Delorise Shiner. Delorise Shiner agreed to review patient's information and return call to CSW, patient and patient's daughter informed. CSW will continue to follow to assist with discharge planning.    CSW received return call from [place] at [facility], Delorise Shiner informed CSW that they would not be able to offer patient a bed. CSW informed patient's daughter, patient's daughter requested to speak with representative from [facility]. [facility] representative agreed to come speak with patient and patient's daughter about facility. CSW will continue to follow, awaiting SNF selection from patient.    Celso Sickle, [place] Clinical Social Worker [facility] Cell#: [phone]

## 2017-01-04 NOTE — Progress Notes (Signed)
CSW contacted by patient's daughter,patient's daughter requested that CSW contact [facility] and [facility] to inquire about bed availability for patient.  CSW contacted both facilities.   Staff member from [facility] reported that patient would need to be discussed with their Librarian, academic before they were able to make a bed offer due to medicaid potential status and that patient would need to pay at least 2 weeks up front for all costs. CSW informed patient's daughter, patient's daughter reported that she planned to tour the facility. CSW informed patient's daughter about the costs associated with SNF.   Staff member from [facility] agreed to return call to CSW.   Patient and patient's daughter aware that SNF selection needs to be made. CSW awaiting SNF selection to continue with placement process.   Celso Sickle, [place] Clinical Social Worker [facility] Cell#: [phone]

## 2017-01-04 NOTE — Progress Notes (Addendum)
PROGRESS NOTE    Richard Bird  ZOX:[id number] DOB: [birth year] DOA: 12/29/2016 PCP: Chevis Pretty, MD  Brief Narrative:  Richard Bird is a 51 y.o. male with medical history significant for long-standing alcohol dependence who presented to the emergency department for evaluation of general weakness, shortness of breath, jaundice, abdominal distention, and bilateral lower extremity edema. Patient reports a long history of daily alcohol use, approximately 5-10 drinks per day and had otherwise been in his usual state of health until approximately 3 weeks ago when he noted the insidious development of right lower extremity swelling. Over the next week or so, he developed swelling in the left leg as well and also noted progressive abdominal distention and yellowing of his skin and eyes. Over the ensuing days, all of these complaints were progressively worsening and the patient was becoming increasingly short of breath with exertion. He denied any fevers or chills, denied chest pain or palpitations, and denied dyspnea while at rest, or cough. There has been no significant nausea, no vomiting, no diarrhea, and no melena or hematochezia. Patient denied any use of illicit substances and denies any history of IV drug abuse. He denied a family history of liver disease. He reports abstaining from alcohol for up to a couple months at a time remotely, but has not gone a day without drinking in many months. He denies history of DTs or seizure, but has noted mild withdrawal symptoms in the past which she has relieved with drinking. Admitted for Decompensated Cirrhosis and found to have a liver Lesion. Gastroenterology was consulter for further evaluation and recommendations for Liver Cirrhosis. Plan to get patient to SNF and Social worker consulted for assistance in placement. Patient Medically stable to be Discharged but awaiting SNF selection by patient's Family.  Assessment & Plan:   Principal Problem:  Cirrhosis of liver with ascites (HCC) Active Problems:   Alcoholic cirrhosis of liver with ascites (HCC)   Hyponatremia   Hypokalemia   LFTs abnormal   Coagulopathy (HCC)   Normocytic anemia   Hepatitis, alcoholic, acute   Liver lesion  1. Alcoholic hepatitis/Decompensated Cirrhosis - Pt with long hx of heavy daily alcohol use, now p/w jaundice, ascites, anasarca - Noted to have t bili 17 with alk phos 244, AST 131, and ALT 33, INR 1.40 on Admission -Repet TBili now (14.3) along with Alk Phos (180) and AST (112), INR 1.56  - CT Abd/Pelvis reveals cirrhotic liver, evidence for portal HTN, and central hypodense liver lesion - Pt was counseled regarding critical importance of abstinence from alcohol going forward; family at bedside are very supportive - C/w acid-suppression, repletion of lytes, supplement vitamins and minerals, monitor with CIWA and prn Ativan - US-guided paracentesis with fluid analysis and done and drained  2.9 Liters of golden fluid that was sent for Analysis -Initial Concern for SBP but WBC was 91 so Stopped Abx - Maddrey discriminant function score is 39 on admission and was 26.5 yesterday's blood work -MELD Score 25 - Discriminant Function Score (My calculation it was 42 and GI's calculation was 30) -Viral Hepatitis panel Negative -Started Patient on Prednisolone 40 mg po Daily and per GI Recc's will need 40 mg po qd x 6 weeks, then 30 mg po qd x 1 week, then 20 mg po qd x 1 week, then 10 mg po qd x 1 week, then D/C -Low Salt Diet Started and Fluid Restrict to 1500 mL -Discontinued Albumin 25 g IV q6h and IV Lasix 20 mg IV q12h;  -C/w  Spironolactone 100 mg po qdaily and 40 mg of po Lasix. -Checked Ammonia Level and was 48; C/w Lactulose 30 grams po BID and repeat Ammonia Level in AM -Gastroenterology recommending non-essential IV Fluids -Will need SNF at D/C and Recommend Inpatient Alcoholic Rehabitlitation after stay at SNF; Per GI will need to Check LFT's and  PT/INR once-a-week at [facility] -Gastroenterology Consulted for further evaluation of decompensate Liver Cirrhosis and appreciated Recc's, Will need to Follow up with GI Clinic in 4-6 Weeks  2. Liver Lesion  - 2.4 cm hypodense lesion noted in central liver on CT -MRI showed Segment 8 right liver lobe 1.8 cm liver lesion, considered LI-RADS category 3 (intermediate probability for [facility]).  -Follow-up MRI abdomen without and with IV contrast recommended in 3-6 months. -AFP 3.2 and repeat was 3.0  3. Hyponatremia, improved and stable - Serum sodium 131 on admission, and repeat was 135 for the last 5 days - Secondary to cirrhosis with reduced effective arterial blood volume - Repeat CMP in AM   4. Hypokalemia  - Serum potassium is 3.4 on admission and was 4.1 this AM -C/w KCl 20 mEQ po Daily while on 40 mg of po Lasix;  -Repeat CMP in AM  5. Coagulapathy  - INR 1.40 on admission without any apparent bleeding and repeat was 1.56;  - Likely secondary to liver disease;  - Continue to monitor   6. Macrocytic Anemia,Thrombocytopenia  - Hgb is 8.4 on admission and platelets 127k; Now 8.6 and Platelets 154 - There is no apparent bleeding, and no pallor  - Likely secondary to alcohol abuse and liver disease - Type and screen performed; anemia panel sent  - Anemia Panel shows Iron Level of 30, TIBC of 246, Sat Ratio of 12, and Ferritin Level of 73 -Repeat CBC in AM  7. Anasarca, improving  - Pt presented with anasarca secondary to decompensated cirrhosis  -Paracentesis as above  -Albumin today was 2.5; Appreciate Nutritionist Consultation - Discontinued Albumin 25 g IV q6h and IV Lasix 20 mg IV q12h;  -C/w Spironolactone 100 mg po qdaily and C/w Lasix 40 mg of po Lasix. - Follow daily wts and I/O's  - RLE much larger than left and venous US done and is Negative for DVT in the Right lower extremity and no Bakers Cyst -Echo to r/o Heart Failure Component -> ECHO showed EF of 60-65% with normal  wall motion abnormalities and LV Diastolic Fxn Parameters were normal   8. Leukocytosis -WBC went from 17.1 -> 13.7 -No Source of Infection Identified; Fluid Analysis sent  -Was concerned for SBP but Fluid Analysis showed 91 WBC so Abx will be stopped -Blood Cx's Ordered by Gastroenterology and showed NGTD at 4 days -Patient now on Prednisolone 40 mg po Daily so question worsening Leukocytosis being Steroid Demargination -Repeat CBC in AM  9. Hyperammonemia -Ammonia Level was 40 yesterday and now 48 -C/w Lactulose 30 mg po BID -Asymptomatic and no Encephalopathy -Repeat Ammonia Level in AM  10. Hypomagnesemia, improved -Patient's Mag Level was 1.8 this AM -Replete as necessary -Repeat Mag Level in AM  DVT prophylaxis: Lovenox 40 mg sq Code Status: FULL CODE Family Communication: Discussed with Son at bedside Disposition Plan: SNF; Patient is medically stable and waiting for family to make Bed Selection   Consultants:   Interventional Radiology  Gastroenterology (signed off 01/03/17)   Procedures: Paracentesis  Antimicrobials: Anti-infectives    Start     Dose/Rate Route Frequency Ordered Stop   12/31/16 1100  cefTRIAXone (ROCEPHIN) 2 g in dextrose 5 % 50 mL IVPB  Status:  Discontinued     2 g 100 mL/hr over 30 Minutes Intravenous Every 24 hours 12/31/16 1050 12/31/16 1522       Subjective: Seen and examined and was doing better than yesterday and stated he was feeling grea. No nausea or vomiting. No CP or SOB. Stressed importance of avoiding EtOH.   Objective: Vitals:   01/03/17 1447 01/03/17 2156 01/04/17 0450 01/04/17 0524  BP: 135/82 (!) 155/93 (!) 157/95   Pulse: 88 91 88   Resp: 16 18 18    Temp: 98 F (36.7 C) 97.9 F (36.6 C) 97.9 F (36.6 C)   TempSrc: Oral Oral Oral   SpO2: 99% 99% 99%   Weight:    96.8 kg (213 lb 6.5 oz)  Height:        Intake/Output Summary (Last 24 hours) at 01/04/17 1416 Last data filed at 01/04/17 0900  Gross per 24 hour    Intake                0 ml  Output             1300 ml  Net            -1300 ml   Filed Weights   01/02/17 0500 01/03/17 0432 01/04/17 0524  Weight: 97.6 kg (215 lb 2.7 oz) 95.9 kg (211 lb 6.7 oz) 96.8 kg (213 lb 6.5 oz)   Examination: Physical Exam:  Constitutional: NAD patient with Jaundice Eyes: Lids normal, scleral icterus noted  ENMT: External Ears, Nose appear normal. Grossly normal hearing.  Neck: Appears normal, supple, no cervical masses, normal ROM, no appreciable thyromegaly Respiratory: Diminished to auscultation bilaterally, no wheezing, rales, rhonchi or crackles. Normal respiratory effort and patient is not tachypenic. No accessory muscle use.  Cardiovascular: RRR, no appreciable murmurs / rubs / gallops. S1 and S2 auscultated. Some Lower extremity edema noted and improved from yesterday Abdomen: Soft, non-tender, distended with fluid wave shift. No masses palpated. Bowel sounds positive x4.  GU: Deferred. Musculoskeletal: No clubbing / cyanosis of digits/nails. No joint deformity upper and lower extremities. Good ROM, no contractures. Skin: Yellowish hue of skin, No rashes, lesions or ulcers noted. Neurologic: CN 2-12 grossly intact with no focal deficits. No Asterixis noted but still had slight tremors when trying to eat and move.  Psychiatric: Normal judgment and insight. Normal mood and appropriate affect.   Data Reviewed: I have personally reviewed following labs and imaging studies  CBC:  Recent Labs Lab 12/31/16 0600 01/01/17 0527 01/02/17 0459 01/03/17 0456 01/04/17 0410  WBC 13.6* 13.6* 15.2* 17.1* 13.7*  NEUTROABS 11.5* 11.6* 13.8* 15.4* 12.0*  HGB 8.0* 8.0* 8.5* 8.7* 8.6*  HCT 24.3* 24.1* 25.7* 26.3* 26.2*  MCV 98.0 99.2 100.8* 102.3* 100.4*  PLT 116* PLATELET CLUMPS NOTED ON SMEAR, UNABLE TO ESTIMATE 139* 165 919   Basic Metabolic Panel:  Recent Labs Lab 12/31/16 0600 01/01/17 0527 01/02/17 0459 01/03/17 0456 01/04/17 0410  NA 135 135  135 135 135  K 3.4* 3.5 3.4* 3.8 4.1  CL 100* 99* 99* 99* 102  CO2 23 23 25 25 25   GLUCOSE 94 89 111* 104* 111*  BUN 18 17 17 19 19   CREATININE 0.85 0.56* 0.74 0.73 0.78  CALCIUM 8.4* 8.3* 8.5* 8.6* 8.5*  MG 1.5* 1.5* 1.9 1.8 1.8  PHOS 3.0 3.2 4.7* 4.2 3.8   GFR: Estimated Creatinine Clearance: 126.7 mL/min (by C-G formula based  on SCr of 0.78 mg/dL). Liver Function Tests:  Recent Labs Lab 12/31/16 0600 01/01/17 0527 01/02/17 0459 01/03/17 0456 01/04/17 0410  AST 125* 117* 106* 119* 112*  ALT 33 33 34 40 44  ALKPHOS 218* 188* 183* 210* 180*  BILITOT 18.0* 18.5* 18.2* 17.3* 14.3*  PROT 7.2 7.0 7.1 7.2 6.7  ALBUMIN 2.9* 2.7* 2.5* 2.7* 2.5*   No results for input(s): LIPASE, AMYLASE in the last 168 hours.  Recent Labs Lab 12/31/16 0600 01/01/17 0527 01/02/17 0459 01/03/17 0456 01/04/17 0410  AMMONIA 57* 58* 32 40* 48*   Coagulation Profile:  Recent Labs Lab 12/30/16 0144 01/01/17 0527 01/02/17 0459 01/03/17 0456 01/04/17 0410  INR 1.41 1.46 1.52 1.54 1.56   Cardiac Enzymes:  Recent Labs Lab 12/29/16 1940  TROPONINI 0.03*   BNP (last 3 results) No results for input(s): PROBNP in the last 8760 hours. HbA1C: No results for input(s): HGBA1C in the last 72 hours. CBG:  Recent Labs Lab 12/31/16 0748 01/01/17 0756 01/02/17 0737 01/03/17 0811 01/04/17 0742  GLUCAP 92 92 102* 85 78   Lipid Profile: No results for input(s): CHOL, HDL, LDLCALC, TRIG, CHOLHDL, LDLDIRECT in the last 72 hours. Thyroid Function Tests: No results for input(s): TSH, T4TOTAL, FREET4, T3FREE, THYROIDAB in the last 72 hours. Anemia Panel: No results for input(s): VITAMINB12, FOLATE, FERRITIN, TIBC, IRON, RETICCTPCT in the last 72 hours. Sepsis Labs: No results for input(s): PROCALCITON, LATICACIDVEN in the last 168 hours.  Recent Results (from the past 240 hour(s))  Body fluid culture     Status: None   Collection Time: 12/30/16  2:49 PM  Result Value Ref Range Status    Specimen Description Peritoneal  Final   Special Requests NONE  Final   Gram Stain   Final    FEW WBC PRESENT, PREDOMINANTLY MONONUCLEAR NO ORGANISMS SEEN    Culture   Final    NO GROWTH 3 DAYS Performed at [facility], [address] [place], [place] [address]    Report Status 01/03/2017 FINAL  Final  Culture, blood (routine x 2)     Status: None (Preliminary result)   Collection Time: 12/31/16  2:51 PM  Result Value Ref Range Status   Specimen Description BLOOD LEFT ANTECUBITAL  Final   Special Requests   Final    BOTTLES DRAWN AEROBIC AND ANAEROBIC Blood Culture adequate volume   Culture   Final    NO GROWTH 4 DAYS Performed at [facility], [place] [address] [place], [place] [address]    Report Status PENDING  Incomplete  Culture, blood (routine x 2)     Status: None (Preliminary result)   Collection Time: 12/31/16  2:57 PM  Result Value Ref Range Status   Specimen Description BLOOD LEFT HAND  Final   Special Requests   Final    BOTTLES DRAWN AEROBIC AND ANAEROBIC Blood Culture adequate volume   Culture   Final    NO GROWTH 4 DAYS Performed at [facility], [place] [address] [place], [place] [address]    Report Status PENDING  Incomplete    Radiology Studies: No results found. Scheduled Meds: . enoxaparin (LOVENOX) injection  40 mg Subcutaneous QHS  . famotidine  20 mg Oral BID  . folic acid  1 mg Oral Daily  . furosemide  40 mg Oral Daily  . lactulose  30 g Oral BID  . multivitamin with minerals  1 tablet Oral Daily  . pantoprazole  40 mg  Oral Daily  . potassium chloride  20 mEq Oral BID  . prednisoLONE  40 mg Oral Daily  . sodium chloride flush  3 mL Intravenous Q12H  . spironolactone  100 mg Oral Daily  . thiamine  100 mg Oral Daily   Continuous Infusions:   LOS: 6 days   Kerney Elbe, DO [facility] Pager [phone]  If 7PM-7AM, please contact night-coverage [email or web address] Password [email or web address] 01/04/2017, 2:16 PM

## 2017-01-04 NOTE — Progress Notes (Signed)
CSW followed up with patient/patient's daughter. Patient's daughter reported that she toured and spoke with staff at [facility] and that they don't have a bed available. Patient's daughter requested that CSW contact [facility] to inquire about their bed availability.   CSW contacted [facility] and confirmed they have one private bed available, CSW informed patient's daughter. Patient's daughter reported they are waiting to hear back from [facility] with a full price prior to making a decision.   CSW staffed case with assistant CSW director. CSW will follow up with patient's family tomorrow.   Celso Sickle, [place] Clinical Social Worker [facility] Cell#: [phone]

## 2017-01-05 LAB — CBC WITH DIFFERENTIAL/PLATELET
BASOS PCT: 0 %
Basophils Absolute: 0 10*3/uL (ref 0.0–0.1)
EOS ABS: 0.1 10*3/uL (ref 0.0–0.7)
Eosinophils Relative: 1 %
HCT: 29.1 % — ABNORMAL LOW (ref 39.0–52.0)
Hemoglobin: 9.6 g/dL — ABNORMAL LOW (ref 13.0–17.0)
Lymphocytes Relative: 9 %
Lymphs Abs: 1.5 10*3/uL (ref 0.7–4.0)
MCH: 33.6 pg (ref 26.0–34.0)
MCHC: 33 g/dL (ref 30.0–36.0)
MCV: 101.7 fL — ABNORMAL HIGH (ref 78.0–100.0)
Monocytes Absolute: 0.3 10*3/uL (ref 0.1–1.0)
Monocytes Relative: 2 %
NEUTROS PCT: 88 %
Neutro Abs: 14.8 10*3/uL — ABNORMAL HIGH (ref 1.7–7.7)
Platelets: 180 10*3/uL (ref 150–400)
RBC: 2.86 MIL/uL — AB (ref 4.22–5.81)
RDW: 17.4 % — ABNORMAL HIGH (ref 11.5–15.5)
WBC: 16.7 10*3/uL — AB (ref 4.0–10.5)

## 2017-01-05 LAB — PROTIME-INR
INR: 1.52
Prothrombin Time: 18.5 seconds — ABNORMAL HIGH (ref 11.4–15.2)

## 2017-01-05 LAB — COMPREHENSIVE METABOLIC PANEL
ALBUMIN: 2.6 g/dL — AB (ref 3.5–5.0)
ALK PHOS: 203 U/L — AB (ref 38–126)
ALT: 54 U/L (ref 17–63)
AST: 110 U/L — AB (ref 15–41)
Anion gap: 8 (ref 5–15)
BUN: 20 mg/dL (ref 6–20)
CALCIUM: 8.7 mg/dL — AB (ref 8.9–10.3)
CO2: 25 mmol/L (ref 22–32)
CREATININE: 0.82 mg/dL (ref 0.61–1.24)
Chloride: 102 mmol/L (ref 101–111)
GFR calc Af Amer: >60 mL/min (ref >60)
GFR calc non Af Amer: >60 mL/min (ref >60)
GLUCOSE: 91 mg/dL (ref 65–99)
Potassium: 3.6 mmol/L (ref 3.5–5.1)
SODIUM: 135 mmol/L (ref 135–145)
Total Bilirubin: 13.4 mg/dL — ABNORMAL HIGH (ref 0.3–1.2)
Total Protein: 7.2 g/dL (ref 6.5–8.1)

## 2017-01-05 LAB — CULTURE, BLOOD (ROUTINE X 2)
Culture: NO GROWTH
Culture: NO GROWTH
SPECIAL REQUESTS: ADEQUATE
Special Requests: ADEQUATE

## 2017-01-05 LAB — PHOSPHORUS: Phosphorus: 4.2 mg/dL (ref 2.5–4.6)

## 2017-01-05 LAB — AMMONIA: Ammonia: 38 umol/L — ABNORMAL HIGH (ref 9–35)

## 2017-01-05 LAB — MAGNESIUM: Magnesium: 1.8 mg/dL (ref 1.7–2.4)

## 2017-01-05 LAB — GLUCOSE, CAPILLARY: Glucose-Capillary: 79 mg/dL (ref 65–99)

## 2017-01-05 MED ORDER — FOLIC ACID 1 MG PO TABS: 1.0000 mg | ORAL_TABLET | Freq: Every day | ORAL | 0 refills | Status: DC

## 2017-01-05 MED ORDER — BISACODYL 5 MG PO TBEC: 5.0000 mg | DELAYED_RELEASE_TABLET | Freq: Every day | ORAL | 0 refills | Status: DC | PRN

## 2017-01-05 MED ORDER — POTASSIUM CHLORIDE CRYS ER 20 MEQ PO TBCR: 20.0000 meq | EXTENDED_RELEASE_TABLET | Freq: Two times a day (BID) | ORAL | 0 refills | Status: DC

## 2017-01-05 MED ORDER — FUROSEMIDE 40 MG PO TABS: 40.0000 mg | ORAL_TABLET | Freq: Every day | ORAL | 0 refills | Status: DC

## 2017-01-05 MED ORDER — THIAMINE HCL 100 MG PO TABS: 100.0000 mg | ORAL_TABLET | Freq: Every day | ORAL | 0 refills | Status: AC

## 2017-01-05 MED ORDER — ADULT MULTIVITAMIN W/MINERALS CH: 1.0000 | ORAL_TABLET | Freq: Every day | ORAL | 0 refills | Status: AC

## 2017-01-05 MED ORDER — LACTULOSE 10 GM/15ML PO SOLN: 30.0000 g | Freq: Two times a day (BID) | ORAL | 0 refills | Status: DC

## 2017-01-05 MED ORDER — PREDNISOLONE 5 MG PO TABS: ORAL_TABLET | ORAL | 0 refills | Status: DC

## 2017-01-05 MED ORDER — FAMOTIDINE 20 MG PO TABS: 20.0000 mg | ORAL_TABLET | Freq: Two times a day (BID) | ORAL | 0 refills | Status: DC

## 2017-01-05 MED ORDER — SPIRONOLACTONE 100 MG PO TABS: 100.0000 mg | ORAL_TABLET | Freq: Every day | ORAL | 0 refills | Status: DC

## 2017-01-05 NOTE — Progress Notes (Signed)
Pt to be discharged to [facility] this afternoon. Report called to Jerelene Redden RN accepting report for this facility. VSS and no acute changes noted in Pt's assessment at time of discharge.

## 2017-01-05 NOTE — Clinical Social Work Placement (Signed)
Patient received and accepted bed offer at [facility]. Patient's RN can call report to [phone], room 116. Patient's being transported to facility by daughter.   CLINICAL SOCIAL WORK PLACEMENT  NOTE  Date:  01/05/2017  Patient Details  Name: Richard Bird MRN: [id number] Date of Birth: 1966-01-12  Clinical Social Work is seeking post-discharge placement for this patient at the Skilled  Nursing Facility level of care (*CSW will initial, date and re-position this form in  chart as items are completed):  Yes   Patient/family provided with [place] Clinical Social Work Department's list of facilities offering this level of care within the geographic area requested by the patient (or if unable, by the patient's family).  Yes   Patient/family informed of their freedom to choose among providers that offer the needed level of care, that participate in Medicare, Medicaid or managed care program needed by the patient, have an available bed and are willing to accept the patient.  Yes   Patient/family informed of [facility]'s ownership interest in [facility] and [facility], as well as of the fact that they are under no obligation to receive care at these facilities.  PASRR submitted to EDS on 01/03/17     PASRR number received on 01/03/17     Existing PASRR number confirmed on       FL2 transmitted to all facilities in geographic area requested by pt/family on 01/03/17     FL2 transmitted to all facilities within larger geographic area on       Patient informed that his/her managed care company has contracts with or will negotiate with certain facilities, including the following:        Yes   Patient/family informed of bed offers received.  Patient chooses bed at [facility]     Physician recommends and patient chooses bed at      Patient to be transferred to [facility] on 12/29/16.  Patient to be transferred to facility by Family       Patient family notified on 01/05/17 of transfer.  Name of family member notified:  Patient's daughter Finnick Orosz) transporting patient to SNF     PHYSICIAN       Additional Comment:    _______________________________________________ Antionette Poles, LCSW 01/05/2017, 1:33 PM

## 2017-01-05 NOTE — Clinical Social Work Placement (Signed)
Patient has received and accepted bed offer at [facility]. CSW will continue to follow to assist with discharge planning to SNF.   CLINICAL SOCIAL WORK PLACEMENT  NOTE  Date:  01/05/2017  Patient Details  Name: Richard Bird MRN: [id number] Date of Birth: 1966/05/06  Clinical Social Work is seeking post-discharge placement for this patient at the Skilled  Nursing Facility level of care (*CSW will initial, date and re-position this form in  chart as items are completed):  Yes   Patient/family provided with [place] Clinical Social Work Department's list of facilities offering this level of care within the geographic area requested by the patient (or if unable, by the patient's family).  Yes   Patient/family informed of their freedom to choose among providers that offer the needed level of care, that participate in Medicare, Medicaid or managed care program needed by the patient, have an available bed and are willing to accept the patient.  Yes   Patient/family informed of [facility]'s ownership interest in [facility] and [facility], as well as of the fact that they are under no obligation to receive care at these facilities.  PASRR submitted to EDS on 01/03/17     PASRR number received on 01/03/17     Existing PASRR number confirmed on       FL2 transmitted to all facilities in geographic area requested by pt/family on 01/03/17     FL2 transmitted to all facilities within larger geographic area on       Patient informed that his/her managed care company has contracts with or will negotiate with certain facilities, including the following:        Yes   Patient/family informed of bed offers received.  Patient chooses bed at [facility]     Physician recommends and patient chooses bed at      Patient to be transferred to   on  .  Patient to be transferred to facility by       Patient family notified on   of transfer.  Name of family member  notified:        PHYSICIAN       Additional Comment:    _______________________________________________ Antionette Poles, LCSW 01/05/2017, 11:46 AM

## 2017-01-05 NOTE — Progress Notes (Signed)
Nutrition Education Note  RD received consult for pt regarding cirrhosis education.  Pt was given "Cirrhosis Nutrition Therapy" handout from the Academy of [employer]etics. Pt was educated today on limiting sodium from the diet. Pt was provided with examples of high sodium foods and provided with names of salt-free alternatives. Pt was also educated on the importance of regular weights and limiting fluid from the diet if advised by MD. Pt was educated on the importance of eating adequate amounts of lean proteins to preserve lean muscle mass and to meet increased estimated needs given pt's current condition. Pt was given examples of healthy proteins and advised on possible supplements.    RD discussed why it is important for patient to adhere to diet recommendations, and emphasized the role of fluids, foods to avoid, and importance of weighing self daily. Teach back method used.  Expect fair compliance.  Body mass index is 29.76 kg/m. Pt meets criteria for overweight based on current BMI.  Current diet order is 2g sodium, patient is consuming approximately 100% of meals at this time. Labs and medications reviewed. Pt scheduled to discharge today. No further nutrition interventions warranted at this time. RD contact information provided. If additional nutrition issues arise, please re-consult RD.   Betsey Holiday, RD, LDN Pager #- [phone]

## 2017-01-05 NOTE — Progress Notes (Signed)
Physical Therapy Treatment Patient Details Name: Richard Bird MRN: [id number] DOB: [birth year] Today's Date: 01/05/2017    History of Present Illness Richard Bird is a 51 y.o. male with medical history significant for long-standing alcohol dependence who presents the emergency department for evaluation of general weakness, shortness of breath, jaundice, abdominal distention, and bilateral lower extremity edema    PT Comments    Pt feeling better.  Assisted with amb a greater distance without AD.  Noted decreased stand time on LE pt stated he has a "bad knee" and wears a brace but left it at home.    Follow Up Recommendations  SNF;Supervision/Assistance - 24 hour ([facility])     Equipment Recommendations  None recommended by PT    Recommendations for Other Services       Precautions / Restrictions Precautions Precautions: Fall Precaution Comments: "bad L knee" wear a brace but left it at home Restrictions Weight Bearing Restrictions: No    Mobility  Bed Mobility Overal bed mobility: Modified Independent                Transfers Overall transfer level: Needs assistance Equipment used: None Transfers: Sit to/from Stand;Stand Pivot Transfers Sit to Stand: Supervision;Min guard Stand pivot transfers: Supervision;Min guard       General transfer comment: mild unsteady with good use of hands.    Ambulation/Gait Ambulation/Gait assistance: Min guard Ambulation Distance (Feet): 285 Feet Assistive device: None Gait Pattern/deviations: Step-to pattern;Decreased stance time - left Gait velocity: decreased   General Gait Details: amb with out any AD this session, noted gait instability with decreased stance time L LE due to "bad knee".  Pt amb flat footed and present with mild stagger to Right.  Would benefit from a straight cane.     Stairs            Wheelchair Mobility    Modified Rankin (Stroke Patients Only)       Balance                                             Cognition Arousal/Alertness: Awake/alert Behavior During Therapy: WFL for tasks assessed/performed Overall Cognitive Status: Impaired/Different from baseline                         Following Commands: Follows one step commands consistently              Exercises      General Comments        Pertinent Vitals/Pain Pain Assessment: No/denies pain    Home Living                      Prior Function            PT Goals (current goals can now be found in the care plan section) Progress towards PT goals: Progressing toward goals    Frequency    Min 3X/week      PT Plan Current plan remains appropriate    Co-evaluation             End of Session Equipment Utilized During Treatment: Gait belt Activity Tolerance: Patient tolerated treatment well Patient left: in chair;with call bell/phone within reach;with chair alarm set;with family/visitor present Nurse Communication: Mobility status PT Visit Diagnosis: Ataxic gait (R26.0);Unsteadiness on feet (R26.81);Other abnormalities of gait and mobility (R26.89)  Time: [id number] Calculation (min) (ACUTE ONLY): 13 min  Charges:  $Gait Training: 8-22 mins                    G Codes:       Felecia Shelling  PTA [facility]  Acute  Rehab Pager      [phone]

## 2017-01-05 NOTE — Discharge Summary (Addendum)
hdc   Discharge Summary  Richard Bird NFA:[id number] DOB: 1966-03-29  PCP: Chevis Pretty, MD  Admit date: 12/29/2016 Discharge date: 01/05/2017  Time spent: 25 minutes   Recommendations for Outpatient Follow-up:  1. Patient will follow-up with Dr. Wonda Horner, [facility] gastroenterology in the next 46 weeks 2. He needs a repeat MRI of the abdomen, specifically follow-up of liver lesion in the next 3-6 months. Dr. Paulita Fujita will coordinate this at his follow-up appointment [place] patient being discharged to a skilled nursing facility  3. New medications: Thiamine, folate, multivitamin, Pepcid, potassium and Lasix 40+ Aldactone 100 4. New medication: Prednisolone taper over the next 8 weeks 5. Patient follow-up with GI in 4-6 weeks. 6. Patient will need weekly checks of liver function test and PT/INR 7. Patient being discharged to skilled nursing facility   Discharge Diagnoses:  Active Hospital Problems   Diagnosis Date Noted  . Cirrhosis of liver with ascites ([place]) 12/29/2016  . Liver lesion   . Alcoholic cirrhosis of liver with ascites ([place]) 12/29/2016  . Hyponatremia 12/29/2016  . Hypokalemia 12/29/2016  . LFTs abnormal 12/29/2016  . Coagulopathy ([place]) 12/29/2016  . Normocytic anemia 12/29/2016  . Hepatitis, alcoholic, acute 46/96/2952    Resolved Hospital Problems   Diagnosis Date Noted Date Resolved  . Decompensation of cirrhosis of liver ([place]) 12/29/2016 12/29/2016    Discharge Condition: Improved, being discharged to skilled nursing  Diet recommendation: Low-sodium  Vitals:   01/04/17 2200 01/05/17 0547  BP: (!) 148/93 (!) 150/96  Pulse:    Resp: 14 16  Temp: 98.2 F (36.8 C) 99.1 F (37.3 C)    History of present illness:  51 year old male with past history of long-standing alcohol abuse with 5-10 drinks per day started having 3 weeks prior to admission lower extremity swelling, abdominal distention and jaundice. He was admitted on 4/11 for decompensated  cirrhosis. At time of admission, scan also noted a liver lesion. Patient admitted to the hospitalist service and gastroenterology consulted.  Hospital Course:  1. Alcoholic hepatitis/Decompensated Cirrhosis - Pt with long hx of heavy daily alcohol use, now p/w jaundice, ascites, anasarca - Noted to have t bili 17 with alk phos 244, AST 131, and ALT 33, INR 1.40 on Admission -Repeat TBili now (14.3) along with Alk Phos (180) and AST (112), INR 1.56  - CT Abd/Pelvis reveals cirrhotic liver, evidence for portal HTN, and central hypodense liver lesion - Pt was counseled regarding critical importance of abstinence from alcohol going forward; family at bedside are very supportive - C/w acid-suppression, repletion of lytes, supplement vitamins and minerals, monitor with CIWA and prn Ativan - US-guided paracentesis with fluid analysis and done and drained 2.9 Liters of golden fluid that was found to be negative for SBP. Prophylactic antibiotics which were started on admission were then discontinued. - Maddrey discriminant function score is 39 on admission and by 4/16 blood work -MELD Score 25 -Viral Hepatitis panel Negative -Started Patient on prednisolone taper that will continue over the next 8 weeks -Low Salt Diet Started and Fluid Restrict to 1500 mL -Initially on IV albumin and Lasix and following paracentesis changed over to by mouth diuretics.  No signs of hepatic encephalopathy. Ammonia level only minimally elevated at 48. Placed on scheduled daily lactulose -Will need SNF at D/C and Recommend Inpatient Alcoholic Rehabitlitation after stay at SNF; Follow up with GI Clinic in 4-6 Weeks  2. Liver Lesion  - 2.4 cm hypodense lesion noted in central liver on CT -MRI showed Segment 8 right  liver lobe 1.8 cm liver lesion, considered LI-RADS category 3 (intermediate probability for [facility]).  -Follow-up MRI abdomen without and with IV contrast recommended in 3-6 months. -AFP 3.2 and repeat was  3.0  3. Hyponatremia, improved and stable - Serum sodium 131 on admission, and repeat was 135 for the last 5 days - Secondary to cirrhosis with reduced effective arterial blood volume - Repeat CMP in AM   4. Hypokalemia  - Serum potassium is 3.4 on admission and was 4.1 this AM -C/w KCl 20 mEQ po Daily while on 40 mg of po Lasix;  -Repeat CMP in AM  5. Coagulapathy  - INR 1.40 on admission without any apparent bleeding and repeat was 1.56;  - Likely secondary to liver disease;  - Continue to monitor   6. Macrocytic Anemia,Thrombocytopenia  - Hgb is 8.4 on admission and platelets 127k; Now 8.6 and Platelets 154 - There is no apparent bleeding, and no pallor and felt to be likely secondary to alcohol abuse and secondary liver disease. Anemia panel more consistent with chronic disease (abdomen mildly secondary to dehydration as well as decompensated cirrhosis. Replaced. Improved with also replacing potassium.  7. Anasarca, improving  - Pt presented with anasarca secondary to decompensated cirrhosis  -Patient started on IV albumin plus IV Lasix. Following paracentesis, these were discontinued. Put on by mouth Lasix plus by mouth spironolactone. - RLE much larger than left and venous US done and is Negative for DVT in the Right lower extremity and no Bakers Cyst -Echo to r/o Heart Failure Component -> ECHO showed EF of 60-65% with normal wall motion abnormalities and LV Diastolic Fxn Parameters were normal   8. Leukocytosis No source of infection identified. SBP ruled out after paracentesis done. Only 91 white blood cells. Blood cultures have been no growth to date 4 days. Started on prednisolone and so elevated white blood cell count felt to be secondary to steroid D margination. Elevated this morning at 16, however no fever, lung exam clear. Normal vital signs. No tachycardia. Urinalysis ordered and pending, although asymptomatic.   9. Hyperammonemia Initially elevated.  Asymptomatic with no secondary encephalopathy. Patient start on lactulose 30 twice a day. Tolerating.   10. Hypomagnesemia, improved Secondary to dehydration brought on by alcohol intake and decompensated cirrhosis. Replaced.  Procedures:  Paracentesis done 4/12 with 2.9 L of fluid removed  Consultations:  Interventional Radiology  Gastroenterology   Discharge Exam: BP (!) 150/96 (BP Location: Left Arm)   Pulse 90   Temp 99.1 F (37.3 C) (Oral)   Resp 16   Ht 5' 11"  (1.803 m)   Wt 96.8 kg (213 lb 6.5 oz)   SpO2 99%   BMI 29.76 kg/m   General: Alert and oriented 3, mild tremulousness  Cardiovascular: Regular rate and rhythm, S1-S2  Respiratory: Clear to auscultation bilaterally   Discharge Instructions You were cared for by a hospitalist during your hospital stay. If you have any questions about your discharge medications or the care you received while you were in the hospital after you are discharged, you can call the unit and asked to speak with the hospitalist on call if the hospitalist that took care of you is not available. Once you are discharged, your primary care physician will handle any further medical issues. Please note that NO REFILLS for any discharge medications will be authorized once you are discharged, as it is imperative that you return to your primary care physician (or establish a relationship with a primary care physician  if you do not have one) for your aftercare needs so that they can reassess your need for medications and monitor your lab values.  Discharge Instructions    Diet - low sodium heart healthy    Complete by:  As directed    Increase activity slowly    Complete by:  As directed      Allergies as of 01/05/2017      Reactions   Penicillins    51 years old, unknown reaction       Medication List    STOP taking these medications   ALEVE PM 220-25 MG Tabs Generic drug:  Naproxen Sod-Diphenhydramine     TAKE these medications    bisacodyl 5 MG EC tablet Commonly known as:  DULCOLAX Take 1 tablet (5 mg total) by mouth daily as needed for moderate constipation.   famotidine 20 MG tablet Commonly known as:  PEPCID Take 1 tablet (20 mg total) by mouth 2 (two) times daily.   folic acid 1 MG tablet Commonly known as:  FOLVITE Take 1 tablet (1 mg total) by mouth daily. Start taking on:  01/06/2017   furosemide 40 MG tablet Commonly known as:  LASIX Take 1 tablet (40 mg total) by mouth daily. Start taking on:  01/06/2017   lactulose 10 GM/15ML solution Commonly known as:  CHRONULAC Take 45 mLs (30 g total) by mouth 2 (two) times daily.   multivitamin with minerals Tabs tablet Take 1 tablet by mouth daily. Start taking on:  01/06/2017   potassium chloride SA 20 MEQ tablet Commonly known as:  K-DUR,KLOR-CON Take 1 tablet (20 mEq total) by mouth 2 (two) times daily.   prednisoLONE 5 MG Tabs tablet 40 mg po qd x 5 weeks, then 30 mg po qd x 1 week, then 20 mg po qd x 1 week, then 10 mg po qd x 1 week, then D/C   spironolactone 100 MG tablet Commonly known as:  ALDACTONE Take 1 tablet (100 mg total) by mouth daily. Start taking on:  01/06/2017   thiamine 100 MG tablet Take 1 tablet (100 mg total) by mouth daily. Start taking on:  01/06/2017      Allergies  Allergen Reactions  . Penicillins     51 years old, unknown reaction    Follow-up Information    Landry Dyke, MD. Schedule an appointment as soon as possible for a visit in 4 week(s).   Specialty:  Gastroenterology Contact information: [address] [place] [address] [phone]        MRI Follow up in 3 month(s).   Why:  Follow-up MRI abdomen without and with IV contrast recommended in 3-6 months. Contact information: Dr.Outlaw of [facility] will help set this up for you           The results of significant diagnostics from this hospitalization (including imaging, microbiology, ancillary and laboratory) are listed  below for reference.    Significant Diagnostic Studies: Dg Chest 2 View  Result Date: 12/29/2016 CLINICAL DATA:  Shortness of breath, fatigue, and lower extremity swelling for 2 weeks. Jaundice. EXAM: CHEST  2 VIEW COMPARISON:  None. FINDINGS: The heart size and mediastinal contours are within normal limits. Low lung volumes noted. Linear opacities in both lung bases may be due to atelectasis or scarring. No evidence of pulmonary consolidation or edema. No evidence of pneumothorax or pleural effusion. IMPRESSION: Low lung volumes, with bibasilar atelectasis versus scarring. Electronically Signed   By: Earle Gell  M.D.   On: 12/29/2016 20:04   Ct Abdomen Pelvis W Contrast  Result Date: 12/29/2016 CLINICAL DATA:  Initial evaluation for acute generalized weakness with fatigue. Evaluate for cirrhosis. EXAM: CT ABDOMEN AND PELVIS WITH CONTRAST TECHNIQUE: Multidetector CT imaging of the abdomen and pelvis was performed using the standard protocol following bolus administration of intravenous contrast. CONTRAST:  154m ISOVUE-300 IOPAMIDOL (ISOVUE-300) INJECTION 61% COMPARISON:  None available. FINDINGS: Lower chest: Scattered patchy and linear atelectatic changes present within the visualized lung bases. Visualized lung bases are otherwise clear. Extensive 3 vessel coronary artery calcifications noted. Hepatobiliary: The liver is heterogeneous in appearance with a nodular contour and caudate lobe hypertrophy, compatible with cirrhosis. Superimposed 2.4 cm hypodense lesion noted within the central aspect of the liver (series 2, image 28), indeterminate. No other definite focal lesions identified. Small granuloma noted. Evidence of portal hypertension with the recannulized paraumbilical vein. Large varix noted within the left abdomen as well. Few scattered smaller varices present within the upper abdomen. Trace hyperdensity within the gallbladder lumen, likely small stone. No biliary dilatation. Pancreas: Pancreas  within normal limits. Spleen: Spleen is enlarged measuring 15.2 cm in craniocaudad dimension. Adrenals/Urinary Tract: Adrenal glands are normal. Kidneys equal in size with symmetric enhancement. No nephrolithiasis, hydronephrosis, or focal enhancing renal mass. No hydroureter. Bladder within normal limits. Stomach/Bowel: Stomach within normal limits. No evidence for bowel obstruction. Scattered hazy stranding and fluid about the small and large bowel felt to be related to underlying intrinsic liver disease. Colonic diverticulosis without definite evidence for acute diverticulitis. Appendix is normal. No definite acute inflammatory changes about the bowels. Vascular/Lymphatic: Extensive aorto bi-iliac atherosclerotic disease. No aneurysm. No pathologically enlarged intra-abdominal or pelvic lymph nodes. Reproductive: Prostate somewhat enlarged with median lobe hypertrophy, invaginating upon the base of the bladder. Other: No free intraperitoneal air. Moderate volume ascites within the abdomen and pelvis. Small amount of fluid extends through a small paraumbilical hernia. Musculoskeletal:  Anasarca noted. No acute osseous abnormality. No worrisome lytic or blastic osseous lesions. Degenerative spondylolysis noted within the lumbar spine, greatest at L5-S1. IMPRESSION: 1. Cirrhosis with moderate volume ascites and evidence of portal hypertension as detailed above. 2. 2.4 cm hypodense lesion within the central aspect of the liver, indeterminate. Given the underlying intrinsic liver disease, further evaluation with dedicated MRI is recommended. 3. Colonic diverticulosis without definite evidence for acute diverticulitis. 4. Cholelithiasis. 5. Extensive coronary artery calcifications with moderate aorto bi-iliac atherosclerotic disease. Electronically Signed   By: BJeannine BogaM.D.   On: 12/29/2016 21:34   Mr Liver W WXLContrast  Result Date: 12/30/2016 CLINICAL DATA:  Inpatient. Alcoholic cirrhosis with  ascites. Indeterminate liver lesion on CT study performed 1 day prior. EXAM: MRI ABDOMEN WITHOUT AND WITH CONTRAST TECHNIQUE: Multiplanar multisequence MR imaging of the abdomen was performed both before and after the administration of intravenous contrast. CONTRAST:  10 cc Eovist IV. COMPARISON:  12/29/2016 CT abdomen/ pelvis FINDINGS: Motion degraded scan. Lower chest: Stable hypoventilatory changes versus scarring at the right greater than left lung bases. Hepatobiliary: Hepatomegaly. Diffusely mildly irregular liver surface, compatible with cirrhosis. Heterogeneous moderate diffuse hepatic steatosis. There is a 1.8 x 1.4 cm focus of abnormal signal intensity in the segment 8 right liver lobe (series 504/ image 44), which demonstrates loss of signal intensity on out of phase chemical shift imaging indicating internal fat, which is occult on the diffusion, T2 weighted, precontrast T1 and hepatobiliary phase postcontrast sequences, with no arterial phase hyperenhancement, and with questionable washout on the portal venous  phase sequences, considered LI-RADS category 3. No additional liver lesions. Normal gallbladder with no cholelithiasis. No biliary ductal dilatation. Common bile duct diameter 3 mm. No choledocholithiasis. Pancreas: No pancreatic mass or duct dilation. No evidence of pancreas divisum. Spleen: Mild splenomegaly (craniocaudal splenic length 15.9 cm). No splenic mass. Adrenals/Urinary Tract: Normal adrenals. No hydronephrosis. Normal kidneys with no renal mass. Stomach/Bowel: Grossly normal stomach. Visualized small and large bowel is normal caliber, with no bowel wall thickening. Vascular/Lymphatic: Normal caliber abdominal aorta. Patent portal, splenic, hepatic and renal veins. Small paraumbilical varices. No pathologically enlarged lymph nodes in the abdomen. Other: Small to moderate volume abdominal ascites. No focal fluid collection. Musculoskeletal: No aggressive appearing focal osseous  lesions. IMPRESSION: 1. Cirrhosis.  Heterogeneous diffuse hepatic steatosis. 2. Segment 8 right liver lobe 1.8 cm liver lesion, considered LI-RADS category 3 (intermediate probability for [facility]). Follow-up MRI abdomen without and with IV contrast recommended in 3-6 months. 3. Mild splenomegaly. Small to moderate volume abdominal ascites. Small paraumbilical varices. Electronically Signed   By: Ilona Sorrel M.D.   On: 12/30/2016 12:42   US Paracentesis  Result Date: 12/30/2016 INDICATION: Jaundice, elevated liver function tests, cirrhosis, liver lesion, ascites. Request made for diagnostic and therapeutic paracentesis. EXAM: ULTRASOUND GUIDED DIAGNOSTIC AND THERAPEUTIC PARACENTESIS MEDICATIONS: None. COMPLICATIONS: None immediate. PROCEDURE: Informed written consent was obtained from the patient after a discussion of the risks, benefits and alternatives to treatment. A timeout was performed prior to the initiation of the procedure. Initial ultrasound scanning demonstrates a small to moderate amount of ascites within the right lower abdominal quadrant. The right lower abdomen was prepped and draped in the usual sterile fashion. 1% lidocaine was used for local anesthesia. Following this, a Yueh catheter was introduced. An ultrasound image was saved for documentation purposes. The paracentesis was performed. The catheter was removed and a dressing was applied. The patient tolerated the procedure well without immediate post procedural complication. FINDINGS: A total of approximately 2.9 liters of golden yellow fluid was removed. Samples were sent to the laboratory as requested by the clinical team. IMPRESSION: Successful ultrasound-guided diagnostic and therapeutic paracentesis yielding 2.9 liters of peritoneal fluid. Read by: Rowe Robert, PA-C Electronically Signed   By: Jerilynn Mages.  Shick M.D.   On: 12/30/2016 16:18    Microbiology: Recent Results (from the past 240 hour(s))  Body fluid culture     Status: None    Collection Time: 12/30/16  2:49 PM  Result Value Ref Range Status   Specimen Description Peritoneal  Final   Special Requests NONE  Final   Gram Stain   Final    FEW WBC PRESENT, PREDOMINANTLY MONONUCLEAR NO ORGANISMS SEEN    Culture   Final    NO GROWTH 3 DAYS Performed at [facility], [address], [place], [place] [address]    Report Status 01/03/2017 FINAL  Final  Culture, blood (routine x 2)     Status: None (Preliminary result)   Collection Time: 12/31/16  2:51 PM  Result Value Ref Range Status   Specimen Description BLOOD LEFT ANTECUBITAL  Final   Special Requests   Final    BOTTLES DRAWN AEROBIC AND ANAEROBIC Blood Culture adequate volume   Culture   Final    NO GROWTH 4 DAYS Performed at [facility], [place] [address] [place], [place] [address]    Report Status PENDING  Incomplete  Culture, blood (routine x 2)     Status: None (Preliminary result)   Collection Time: 12/31/16  2:57 PM  Result Value Ref Range Status   Specimen Description BLOOD LEFT HAND  Final   Special Requests   Final    BOTTLES DRAWN AEROBIC AND ANAEROBIC Blood Culture adequate volume   Culture   Final    NO GROWTH 4 DAYS Performed at [facility], [address] [place], [place] [address]    Report Status PENDING  Incomplete     Labs: Basic Metabolic Panel:  Recent Labs Lab 01/01/17 0527 01/02/17 0459 01/03/17 0456 01/04/17 0410 01/05/17 0543  NA 135 135 135 135 135  K 3.5 3.4* 3.8 4.1 3.6  CL 99* 99* 99* 102 102  CO2 23 25 25 25 25   GLUCOSE 89 111* 104* 111* 91  BUN 17 17 19 19 20   CREATININE 0.56* 0.74 0.73 0.78 0.82  CALCIUM 8.3* 8.5* 8.6* 8.5* 8.7*  MG 1.5* 1.9 1.8 1.8 1.8  PHOS 3.2 4.7* 4.2 3.8 4.2   Liver Function Tests:  Recent Labs Lab 01/01/17 0527 01/02/17 0459 01/03/17 0456 01/04/17 0410 01/05/17 0543  AST 117* 106* 119* 112* 110*  ALT 33 34 40 44 54  ALKPHOS 188* 183* 210* 180* 203*  BILITOT 18.5* 18.2* 17.3* 14.3* 13.4*  PROT 7.0 7.1  7.2 6.7 7.2  ALBUMIN 2.7* 2.5* 2.7* 2.5* 2.6*   No results for input(s): LIPASE, AMYLASE in the last 168 hours.  Recent Labs Lab 01/01/17 0527 01/02/17 0459 01/03/17 0456 01/04/17 0410 01/05/17 0543  AMMONIA 58* 32 40* 48* 38*   CBC:  Recent Labs Lab 01/01/17 0527 01/02/17 0459 01/03/17 0456 01/04/17 0410 01/05/17 0543  WBC 13.6* 15.2* 17.1* 13.7* 16.7*  NEUTROABS 11.6* 13.8* 15.4* 12.0* 14.8*  HGB 8.0* 8.5* 8.7* 8.6* 9.6*  HCT 24.1* 25.7* 26.3* 26.2* 29.1*  MCV 99.2 100.8* 102.3* 100.4* 101.7*  PLT PLATELET CLUMPS NOTED ON SMEAR, UNABLE TO ESTIMATE 139* 165 154 180   Cardiac Enzymes:  Recent Labs Lab 12/29/16 1940  TROPONINI 0.03*   BNP: BNP (last 3 results)  Recent Labs  12/29/16 1940  BNP 108.9*    ProBNP (last 3 results) No results for input(s): PROBNP in the last 8760 hours.  CBG:  Recent Labs Lab 01/01/17 0756 01/02/17 0737 01/03/17 0811 01/04/17 0742 01/05/17 0806  GLUCAP 92 102* 85 78 79       Signed:  Annita Brod, MD [facility] 01/05/2017, 12:33 PM

## 2017-01-07 ENCOUNTER — Encounter: Payer: Self-pay | Admitting: Adult Health

## 2017-01-07 ENCOUNTER — Non-Acute Institutional Stay (SKILLED_NURSING_FACILITY): Payer: Self-pay | Admitting: Adult Health

## 2017-01-07 DIAGNOSIS — D649 Anemia, unspecified: Secondary | ICD-10-CM

## 2017-01-07 DIAGNOSIS — D689 Coagulation defect, unspecified: Secondary | ICD-10-CM

## 2017-01-07 DIAGNOSIS — K7010 Alcoholic hepatitis without ascites: Secondary | ICD-10-CM

## 2017-01-07 DIAGNOSIS — K7031 Alcoholic cirrhosis of liver with ascites: Secondary | ICD-10-CM

## 2017-01-07 NOTE — Progress Notes (Signed)
Location:   [facility] Room Number: 116 A Place of Service:  SNF (31)    CODE STATUS: full code   Allergies  Allergen Reactions  . Penicillins     51 years old, unknown reaction     Chief Complaint  Patient presents with  . Discharge Note    HPI:  He is being discharged to home. He will not need any dme or home health. He will need his prescriptions written. He will need to follow up with his medical provider.  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     History reviewed. No pertinent past medical history.  Past Surgical History:  Procedure Laterality Date  . HERNIA REPAIR    . KNEE SURGERY      Social History   Social History  . Marital status: Married    Spouse name: N/A  . Number of children: N/A  . Years of education: N/A   Occupational History  . Not on file.   Social History Main Topics  . Smoking status: Never Smoker  . Smokeless tobacco: Never Used  . Alcohol use 25.2 oz/week    42 Shots of liquor per week  . Drug use: No  . Sexual activity: Not on file   Other Topics Concern  . Not on file   Social History Narrative  . No narrative on file   History reviewed. No pertinent family history.  VITAL SIGNS BP (!) 142/70   Pulse 78   Temp 97.6 F (36.4 C)   Resp 20   Ht  (1.803 m)   Wt 213 lb (96.6 kg)   SpO2 98%   BMI 29.71 kg/m   Patient's Medications  New Prescriptions   No medications on file  Previous Medications   BISACODYL (DULCOLAX) 5 MG EC TABLET    Take 1 tablet (5 mg total) by mouth daily as needed for moderate constipation.   FAMOTIDINE (PEPCID) 20 MG TABLET    Take 1 tablet (20 mg total) by mouth 2 (two) times daily.   FOLIC ACID (FOLVITE) 1 MG TABLET    Take 1 tablet (1 mg total) by mouth daily.   FUROSEMIDE (LASIX) 40 MG TABLET    Take 1 tablet (40 mg total) by mouth daily.   LACTULOSE (CHRONULAC) 10 GM/15ML SOLUTION    Take 45 mLs (30 g total) by mouth 2 (two) times daily.   MULTIPLE VITAMIN (MULTIVITAMIN WITH MINERALS) TABS TABLET    Take 1 tablet by mouth daily.   POTASSIUM CHLORIDE SA (K-DUR,KLOR-CON) 20 MEQ TABLET    Take 1 tablet (20 mEq total) by mouth 2 (two) times daily.   PREDNISOLONE 5 MG TABS TABLET    40 mg po qd x 5 weeks, then 30 mg po qd x 1 week, then 20 mg po qd x 1 week, then 10 mg po qd x 1 week, then D/C   SPIRONOLACTONE (ALDACTONE) 100 MG TABLET    Take 1 tablet (100 mg total) by mouth daily.   THIAMINE 100 MG TABLET    Take 1 tablet (100 mg total) by mouth daily.  Modified Medications   No medications on file  Discontinued Medications   No medications on file     SIGNIFICANT DIAGNOSTIC EXAMS   Review of Systems  Constitutional: Negative for malaise/fatigue.  Respiratory: Negative for cough and shortness of breath.   Cardiovascular: Negative for chest pain, palpitations and leg swelling.  Gastrointestinal: Negative for abdominal pain, constipation and  heartburn.  Musculoskeletal: Negative for back pain, joint pain and myalgias.  Skin: Negative.   Neurological: Negative for dizziness.  Psychiatric/Behavioral: The patient is not nervous/anxious.     Physical Exam  Constitutional: He is oriented to person, place, and time. No distress.  Eyes: Conjunctivae are normal. Scleral icterus is present.  Neck: Neck supple. No JVD present. No thyromegaly present.  Cardiovascular: Normal rate, regular rhythm and intact distal pulses.   Respiratory: Effort normal and breath sounds normal. No respiratory distress. He has no wheezes.  GI: Soft. Bowel sounds are normal. He exhibits no distension. There is no tenderness.  Musculoskeletal: He exhibits no edema.  Able to move all extremities   Lymphadenopathy:    He has no cervical adenopathy.  Neurological: He is alert and oriented to person, place, and time.  Skin: Skin is warm and dry. He is not diaphoretic.  Jaundice   Psychiatric: He has a normal mood and affect.     ASSESSMENT/  PLAN:  Patient is being discharged with the following home health services:  None required   Patient is being discharged with the following durable medical equipment:  None required   Patient has been advised to f/u with their PCP in 1-2 weeks to bring them up to date on their rehab stay.  Social services at facility was responsible for arranging this appointment.  Pt was provided with a 30 day supply of prescriptions for medications and refills must be obtained from their PCP.  For controlled substances, a more limited supply may be provided adequate until PCP appointment only.   Time spent with patient  40  minutes >50% time spent counseling; reviewing medical record; tests; labs; and developing future plan of care    Synthia Innocent NP [facility]  Contact [phone] Monday through Friday 8am- 5pm  After hours call [phone]

## 2017-01-23 ENCOUNTER — Encounter: Payer: Self-pay | Admitting: Adult Health

## 2017-01-25 ENCOUNTER — Inpatient Hospital Stay (HOSPITAL_COMMUNITY)
Admission: EM | Admit: 2017-01-25 | Discharge: 2017-01-31 | DRG: 641 | Disposition: A | Discharge status: Home / Self Care | Payer: Medicaid Other | Attending: Internal Medicine | Admitting: Internal Medicine

## 2017-01-25 DIAGNOSIS — Z8249 Family history of ischemic heart disease and other diseases of the circulatory system: Secondary | ICD-10-CM | POA: Diagnosis not present

## 2017-01-25 DIAGNOSIS — T380X5A Adverse effect of glucocorticoids and synthetic analogues, initial encounter: Secondary | ICD-10-CM | POA: Diagnosis present

## 2017-01-25 DIAGNOSIS — D649 Anemia, unspecified: Secondary | ICD-10-CM | POA: Diagnosis present

## 2017-01-25 DIAGNOSIS — K7010 Alcoholic hepatitis without ascites: Secondary | ICD-10-CM | POA: Diagnosis present

## 2017-01-25 DIAGNOSIS — F10239 Alcohol dependence with withdrawal, unspecified: Secondary | ICD-10-CM | POA: Diagnosis present

## 2017-01-25 DIAGNOSIS — K7011 Alcoholic hepatitis with ascites: Secondary | ICD-10-CM | POA: Diagnosis present

## 2017-01-25 DIAGNOSIS — D72829 Elevated white blood cell count, unspecified: Secondary | ICD-10-CM | POA: Diagnosis present

## 2017-01-25 DIAGNOSIS — E8770 Fluid overload, unspecified: Secondary | ICD-10-CM | POA: Diagnosis present

## 2017-01-25 DIAGNOSIS — E872 Acidosis: Secondary | ICD-10-CM | POA: Diagnosis present

## 2017-01-25 DIAGNOSIS — I959 Hypotension, unspecified: Secondary | ICD-10-CM | POA: Diagnosis present

## 2017-01-25 DIAGNOSIS — E875 Hyperkalemia: Secondary | ICD-10-CM | POA: Diagnosis present

## 2017-01-25 DIAGNOSIS — N179 Acute kidney failure, unspecified: Secondary | ICD-10-CM | POA: Diagnosis present

## 2017-01-25 DIAGNOSIS — E861 Hypovolemia: Secondary | ICD-10-CM | POA: Diagnosis present

## 2017-01-25 DIAGNOSIS — Z79899 Other long term (current) drug therapy: Secondary | ICD-10-CM

## 2017-01-25 DIAGNOSIS — R262 Difficulty in walking, not elsewhere classified: Secondary | ICD-10-CM

## 2017-01-25 DIAGNOSIS — K7031 Alcoholic cirrhosis of liver with ascites: Secondary | ICD-10-CM | POA: Diagnosis present

## 2017-01-25 DIAGNOSIS — E871 Hypo-osmolality and hyponatremia: Secondary | ICD-10-CM | POA: Diagnosis present

## 2017-01-25 LAB — CBC WITH DIFFERENTIAL/PLATELET
BASOS PCT: 0 %
Basophils Absolute: 0 10*3/uL (ref 0.0–0.1)
EOS ABS: 0.5 10*3/uL (ref 0.0–0.7)
Eosinophils Relative: 1 %
HEMATOCRIT: 25.2 % — AB (ref 39.0–52.0)
HEMOGLOBIN: 9.2 g/dL — AB (ref 13.0–17.0)
LYMPHS ABS: 2.1 10*3/uL (ref 0.7–4.0)
Lymphocytes Relative: 6 %
MCH: 32.6 pg (ref 26.0–34.0)
MCHC: 36.5 g/dL — AB (ref 30.0–36.0)
MCV: 89.4 fL (ref 78.0–100.0)
MONOS PCT: 1 %
Monocytes Absolute: 0.2 10*3/uL (ref 0.1–1.0)
NEUTROS ABS: 33.9 10*3/uL — AB (ref 1.7–7.7)
NEUTROS PCT: 93 %
Platelets: 283 10*3/uL (ref 150–400)
RBC: 2.82 MIL/uL — AB (ref 4.22–5.81)
RDW: 13.9 % (ref 11.5–15.5)
WBC: 36.6 10*3/uL — AB (ref 4.0–10.5)

## 2017-01-25 LAB — COMPREHENSIVE METABOLIC PANEL
ALK PHOS: 249 U/L — AB (ref 38–126)
ALT: 112 U/L — AB (ref 17–63)
AST: 95 U/L — ABNORMAL HIGH (ref 15–41)
Albumin: 3.1 g/dL — ABNORMAL LOW (ref 3.5–5.0)
Anion gap: 9 (ref 5–15)
BILIRUBIN TOTAL: 7 mg/dL — AB (ref 0.3–1.2)
BUN: 41 mg/dL — ABNORMAL HIGH (ref 6–20)
CALCIUM: 8.5 mg/dL — AB (ref 8.9–10.3)
CO2: 22 mmol/L (ref 22–32)
CREATININE: 1.34 mg/dL — AB (ref 0.61–1.24)
Chloride: 80 mmol/L — ABNORMAL LOW (ref 101–111)
GFR calc Af Amer: >60 mL/min (ref >60)
GFR calc non Af Amer: 59 mL/min — ABNORMAL LOW (ref >60)
GLUCOSE: 107 mg/dL — AB (ref 65–99)
Potassium: 5.3 mmol/L — ABNORMAL HIGH (ref 3.5–5.1)
SODIUM: 111 mmol/L — AB (ref 135–145)
TOTAL PROTEIN: 7.3 g/dL (ref 6.5–8.1)

## 2017-01-25 MED ORDER — SODIUM CHLORIDE 0.9 % IV SOLN
Freq: Once | INTRAVENOUS | Status: AC
  Administered 2017-01-25: 21:00:00 via INTRAVENOUS

## 2017-01-25 NOTE — ED Notes (Signed)
Pt is alert and oriented x 4 and is verbally responsive. Pt family is at bedside, pt has a blank stare when asked questions and a slow or delayed response. 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 Pt denies pain at this time and does report feeling increased weakness in the AM. Pt denies n/v/d and  Fever. Pt scelera is jaundiced

## 2017-01-25 NOTE — ED Provider Notes (Signed)
[facility]-EMERGENCY DEPT Provider Note   CSN: [id number] Arrival date & time: 01/25/17  1812     History   Chief Complaint Chief Complaint  Patient presents with  . Abnormal Lab    HPI Richard Bird is a 51 y.o. male.  51 year old male presents at the request of his physician for abnormal labs that were drawn today. Patient states that his white blood cell, and sodium were noted to be abnormal. He does have a history of alcohol hepatitis and is currently on an 8 week prednisone taper. He denies any fever or chills. No black or bloody stools. No emesis noted. Denies abdominal discomfort. Does complain of diffuse weakness. He is also on a low sodium diet as well. Denies any changes to his urine.      Past Medical History:  Diagnosis Date  . Cirrhosis of liver with ascites (HCC) 12/29/2016    Patient Active Problem List   Diagnosis Date Noted  . Liver lesion   . Alcoholic cirrhosis of liver with ascites (HCC) 12/29/2016  . Hyponatremia 12/29/2016  . Hypokalemia 12/29/2016  . LFTs abnormal 12/29/2016  . Coagulopathy (HCC) 12/29/2016  . Normocytic anemia 12/29/2016  . Cirrhosis of liver with ascites (HCC) 12/29/2016  . Hepatitis, alcoholic, acute 12/29/2016    Past Surgical History:  Procedure Laterality Date  . HERNIA REPAIR    . KNEE SURGERY         Home Medications    Prior to Admission medications   Medication Sig Start Date End Date Taking? Authorizing Provider  bisacodyl (DULCOLAX) 5 MG EC tablet Take 1 tablet (5 mg total) by mouth daily as needed for moderate constipation. 01/05/17   Hollice Espy, MD  famotidine (PEPCID) 20 MG tablet Take 1 tablet (20 mg total) by mouth 2 (two) times daily. 01/05/17   Hollice Espy, MD  folic acid (FOLVITE) 1 MG tablet Take 1 tablet (1 mg total) by mouth daily. 01/06/17   Hollice Espy, MD  furosemide (LASIX) 40 MG tablet Take 1 tablet (40 mg total) by mouth daily. 01/06/17   Hollice Espy, MD  lactulose  (CHRONULAC) 10 GM/15ML solution Take 45 mLs (30 g total) by mouth 2 (two) times daily. 01/05/17   Hollice Espy, MD  Multiple Vitamin (MULTIVITAMIN WITH MINERALS) TABS tablet Take 1 tablet by mouth daily. 01/06/17   Hollice Espy, MD  potassium chloride SA (K-DUR,KLOR-CON) 20 MEQ tablet Take 1 tablet (20 mEq total) by mouth 2 (two) times daily. 01/05/17   Hollice Espy, MD  prednisoLONE 5 MG TABS tablet 40 mg po qd x 5 weeks, then 30 mg po qd x 1 week, then 20 mg po qd x 1 week, then 10 mg po qd x 1 week, then D/C 01/05/17   Hollice Espy, MD  spironolactone (ALDACTONE) 100 MG tablet Take 1 tablet (100 mg total) by mouth daily. 01/06/17   Hollice Espy, MD  thiamine 100 MG tablet Take 1 tablet (100 mg total) by mouth daily. 01/06/17   Hollice Espy, MD    Family History No family history on file.  Social History Social History  Substance Use Topics  . Smoking status: Never Smoker  . Smokeless tobacco: Never Used  . Alcohol use 25.2 oz/week    42 Shots of liquor per week     Allergies   Penicillins   Review of Systems Review of Systems  All other systems reviewed and are negative.    Physical Exam  Updated Vital Signs BP 121/69 (BP Location: Right Arm)   Pulse 85   Temp 97.9 F (36.6 C) (Oral)   Resp 18   Ht 5\' 11"  (1.803 m)   Wt 88.5 kg   SpO2 100%   BMI 27.20 kg/m   Physical Exam  Constitutional: He is oriented to person, place, and time. He appears well-developed and well-nourished.  Non-toxic appearance. No distress.  HENT:  Head: Normocephalic and atraumatic.  Eyes: Conjunctivae, EOM and lids are normal. Pupils are equal, round, and reactive to light. Scleral icterus is present.  Neck: Normal range of motion. Neck supple. No tracheal deviation present. No thyroid mass present.  Cardiovascular: Normal rate, regular rhythm and normal heart sounds.  Exam reveals no gallop.   No murmur heard. Pulmonary/Chest: Effort normal and breath sounds  normal. No stridor. No respiratory distress. He has no decreased breath sounds. He has no wheezes. He has no rhonchi. He has no rales.  Abdominal: Soft. Bowel sounds are normal. He exhibits ascites. He exhibits no distension. There is no tenderness. There is no rigidity, no rebound, no guarding and no CVA tenderness.  Musculoskeletal: Normal range of motion. He exhibits no edema or tenderness.  Neurological: He is alert and oriented to person, place, and time. He has normal strength. No cranial nerve deficit or sensory deficit. GCS eye subscore is 4. GCS verbal subscore is 5. GCS motor subscore is 6.  Skin: Skin is warm and dry. No abrasion and no rash noted.  Psychiatric: He has a normal mood and affect. His speech is normal and behavior is normal.  Nursing note and vitals reviewed.    ED Treatments / Results  Labs (all labs ordered are listed, but only abnormal results are displayed) Labs Reviewed  CBC WITH DIFFERENTIAL/PLATELET - Abnormal; Notable for the following:       Result Value   WBC 36.6 (*)    RBC 2.82 (*)    Hemoglobin 9.2 (*)    HCT 25.2 (*)    MCHC 36.5 (*)    Neutro Abs 33.9 (*)    All other components within normal limits  COMPREHENSIVE METABOLIC PANEL - Abnormal; Notable for the following:    Sodium 111 (*)    Potassium 5.3 (*)    Chloride 80 (*)    Glucose, Bld 107 (*)    BUN 41 (*)    Creatinine, Ser 1.34 (*)    Calcium 8.5 (*)    Albumin 3.1 (*)    AST 95 (*)    ALT 112 (*)    Alkaline Phosphatase 249 (*)    Total Bilirubin 7.0 (*)    GFR calc non Af Amer 59 (*)    All other components within normal limits    EKG  EKG Interpretation None       Radiology No results found.  Procedures Procedures (including critical care time)  Medications Ordered in ED Medications  0.9 %  sodium chloride infusion ( Intravenous New Bag/Given 01/25/17 2110)     Initial Impression / Assessment and Plan / ED Course  I have reviewed the triage vital signs and  the nursing notes.  Pertinent labs & imaging results that were available during my care of the patient were reviewed by me and considered in my medical decision making (see chart for details).     Patient with evidence of hyponatremia on blood work. He also has looks cytosis (from his use of steroids. Patient is on a diuretic and does have  evidence of acute kidney injury. Will be admitted to the hospitalist for further management  Final Clinical Impressions(s) / ED Diagnoses   Final diagnoses:  None    New Prescriptions New Prescriptions   No medications on file     Lorre NickAllen, Elliette Seabolt, MD 01/25/17 2115

## 2017-01-25 NOTE — ED Triage Notes (Signed)
Patient states he was called by his PCP today and was told he had a low WBC  And to come to the ED.  Patient c/o fatigue in his lower extremities, insomnia, and dizziness in the mornings.

## 2017-01-26 ENCOUNTER — Encounter (HOSPITAL_COMMUNITY): Payer: Self-pay | Admitting: Family Medicine

## 2017-01-26 DIAGNOSIS — D72829 Elevated white blood cell count, unspecified: Secondary | ICD-10-CM | POA: Diagnosis present

## 2017-01-26 DIAGNOSIS — N179 Acute kidney failure, unspecified: Secondary | ICD-10-CM | POA: Diagnosis present

## 2017-01-26 LAB — SODIUM, URINE, RANDOM: Sodium, Ur: 40 mmol/L

## 2017-01-26 LAB — OSMOLALITY, URINE: OSMOLALITY UR: 327 mosm/kg (ref 300–900)

## 2017-01-26 LAB — OSMOLALITY: Osmolality: 250 mOsm/kg — ABNORMAL LOW (ref 275–295)

## 2017-01-26 LAB — URINALYSIS, ROUTINE W REFLEX MICROSCOPIC
Bilirubin Urine: NEGATIVE
Glucose, UA: NEGATIVE mg/dL
HGB URINE DIPSTICK: NEGATIVE
KETONES UR: NEGATIVE mg/dL
LEUKOCYTES UA: NEGATIVE
Nitrite: NEGATIVE
PROTEIN: NEGATIVE mg/dL
Specific Gravity, Urine: 1.008 (ref 1.005–1.030)
pH: 6 (ref 5.0–8.0)

## 2017-01-26 LAB — BASIC METABOLIC PANEL
Anion gap: 7 (ref 5–15)
Anion gap: 7 (ref 5–15)
BUN: 37 mg/dL — ABNORMAL HIGH (ref 6–20)
BUN: 35 mg/dL — ABNORMAL HIGH (ref 6–20)
CHLORIDE: 94 mmol/L — AB (ref 101–111)
CO2: 18 mmol/L — ABNORMAL LOW (ref 22–32)
CO2: 19 mmol/L — ABNORMAL LOW (ref 22–32)
CREATININE: 1.03 mg/dL (ref 0.61–1.24)
Calcium: 8.3 mg/dL — ABNORMAL LOW (ref 8.9–10.3)
Calcium: 8.4 mg/dL — ABNORMAL LOW (ref 8.9–10.3)
Chloride: 93 mmol/L — ABNORMAL LOW (ref 101–111)
Creatinine, Ser: 1.24 mg/dL (ref 0.61–1.24)
GFR calc non Af Amer: >60 mL/min (ref >60)
GLUCOSE: 156 mg/dL — AB (ref 65–99)
Glucose, Bld: 149 mg/dL — ABNORMAL HIGH (ref 65–99)
POTASSIUM: 4.9 mmol/L (ref 3.5–5.1)
Potassium: 4.8 mmol/L (ref 3.5–5.1)
SODIUM: 119 mmol/L — AB (ref 135–145)
Sodium: 119 mmol/L — CL (ref 135–145)

## 2017-01-26 LAB — COMPREHENSIVE METABOLIC PANEL
ALT: 89 U/L — AB (ref 17–63)
AST: 72 U/L — AB (ref 15–41)
Albumin: 2.7 g/dL — ABNORMAL LOW (ref 3.5–5.0)
Alkaline Phosphatase: 205 U/L — ABNORMAL HIGH (ref 38–126)
Anion gap: 7 (ref 5–15)
BUN: 42 mg/dL — ABNORMAL HIGH (ref 6–20)
CHLORIDE: 86 mmol/L — AB (ref 101–111)
CO2: 21 mmol/L — AB (ref 22–32)
CREATININE: 1.11 mg/dL (ref 0.61–1.24)
Calcium: 8.1 mg/dL — ABNORMAL LOW (ref 8.9–10.3)
GFR calc non Af Amer: >60 mL/min (ref >60)
Glucose, Bld: 131 mg/dL — ABNORMAL HIGH (ref 65–99)
POTASSIUM: 4.9 mmol/L (ref 3.5–5.1)
SODIUM: 114 mmol/L — AB (ref 135–145)
Total Bilirubin: 5.6 mg/dL — ABNORMAL HIGH (ref 0.3–1.2)
Total Protein: 5.9 g/dL — ABNORMAL LOW (ref 6.5–8.1)

## 2017-01-26 MED ORDER — LORAZEPAM 1 MG PO TABS: 0.0000 mg | ORAL_TABLET | Freq: Two times a day (BID) | ORAL | Status: DC

## 2017-01-26 MED ORDER — LACTULOSE 10 GM/15ML PO SOLN
30.0000 g | Freq: Two times a day (BID) | ORAL | Status: DC
  Administered 2017-01-26 – 2017-01-31 (×12): 30 g via ORAL
  Filled 2017-01-26 (×12): qty 45

## 2017-01-26 MED ORDER — LORAZEPAM 2 MG/ML IJ SOLN
1.0000 mg | Freq: Four times a day (QID) | INTRAMUSCULAR | Status: DC | PRN
  Administered 2017-01-27: 1 mg via INTRAVENOUS
  Filled 2017-01-26: qty 1

## 2017-01-26 MED ORDER — PREDNISONE 20 MG PO TABS
40.0000 mg | ORAL_TABLET | Freq: Every day | ORAL | Status: DC
  Administered 2017-01-26 – 2017-01-31 (×6): 40 mg via ORAL
  Filled 2017-01-26 (×6): qty 2

## 2017-01-26 MED ORDER — VITAMIN B-1 100 MG PO TABS
100.0000 mg | ORAL_TABLET | Freq: Every day | ORAL | Status: DC
  Administered 2017-01-28 – 2017-01-31 (×4): 100 mg via ORAL
  Filled 2017-01-26 (×5): qty 1

## 2017-01-26 MED ORDER — LORAZEPAM 1 MG PO TABS
1.0000 mg | ORAL_TABLET | Freq: Four times a day (QID) | ORAL | Status: DC | PRN
  Administered 2017-01-26: 1 mg via ORAL

## 2017-01-26 MED ORDER — HYDROXYZINE HCL 25 MG PO TABS: 25.0000 mg | ORAL_TABLET | Freq: Three times a day (TID) | ORAL | Status: DC | PRN

## 2017-01-26 MED ORDER — THIAMINE HCL 100 MG/ML IJ SOLN
100.0000 mg | Freq: Every day | INTRAMUSCULAR | Status: DC
  Administered 2017-01-27: 100 mg via INTRAVENOUS
  Filled 2017-01-26: qty 2

## 2017-01-26 MED ORDER — DEXTROSE 5 % IV SOLN
INTRAVENOUS | Status: DC
  Administered 2017-01-26 (×2): via INTRAVENOUS

## 2017-01-26 MED ORDER — LORAZEPAM 1 MG PO TABS
0.0000 mg | ORAL_TABLET | Freq: Four times a day (QID) | ORAL | Status: DC
  Administered 2017-01-26 – 2017-01-27 (×2): 1 mg via ORAL
  Administered 2017-01-27: 2 mg via ORAL
  Administered 2017-01-27: 1 mg via ORAL
  Filled 2017-01-26 (×2): qty 1
  Filled 2017-01-26: qty 4
  Filled 2017-01-26: qty 2
  Filled 2017-01-26: qty 1

## 2017-01-26 MED ORDER — FOLIC ACID 1 MG PO TABS
1.0000 mg | ORAL_TABLET | Freq: Every day | ORAL | Status: DC
  Administered 2017-01-27 – 2017-01-31 (×5): 1 mg via ORAL
  Filled 2017-01-26 (×5): qty 1

## 2017-01-26 MED ORDER — GABAPENTIN 300 MG PO CAPS
300.0000 mg | ORAL_CAPSULE | Freq: Every day | ORAL | Status: DC
  Administered 2017-01-26 – 2017-01-30 (×6): 300 mg via ORAL
  Filled 2017-01-26 (×6): qty 1

## 2017-01-26 MED ORDER — FAMOTIDINE 20 MG PO TABS
20.0000 mg | ORAL_TABLET | Freq: Two times a day (BID) | ORAL | Status: DC
  Administered 2017-01-26 – 2017-01-31 (×12): 20 mg via ORAL
  Filled 2017-01-26 (×12): qty 1

## 2017-01-26 MED ORDER — SPIRONOLACTONE 25 MG PO TABS
50.0000 mg | ORAL_TABLET | Freq: Every day | ORAL | Status: DC
  Administered 2017-01-26: 50 mg via ORAL
  Filled 2017-01-26: qty 2

## 2017-01-26 MED ORDER — VITAMIN B-1 100 MG PO TABS
100.0000 mg | ORAL_TABLET | Freq: Every day | ORAL | Status: DC
  Administered 2017-01-26: 100 mg via ORAL
  Filled 2017-01-26: qty 1

## 2017-01-26 MED ORDER — FOLIC ACID 1 MG PO TABS
1.0000 mg | ORAL_TABLET | Freq: Every day | ORAL | Status: DC
  Administered 2017-01-26: 1 mg via ORAL
  Filled 2017-01-26: qty 1

## 2017-01-26 MED ORDER — BISACODYL 5 MG PO TBEC: 5.0000 mg | DELAYED_RELEASE_TABLET | Freq: Every day | ORAL | Status: DC | PRN

## 2017-01-26 MED ORDER — SODIUM CHLORIDE 0.45 % IV SOLN
INTRAVENOUS | Status: DC
  Administered 2017-01-26: 16:00:00 via INTRAVENOUS

## 2017-01-26 MED ORDER — ADULT MULTIVITAMIN W/MINERALS CH
1.0000 | ORAL_TABLET | Freq: Every day | ORAL | Status: DC
  Administered 2017-01-26 – 2017-01-31 (×6): 1 via ORAL
  Filled 2017-01-26 (×6): qty 1

## 2017-01-26 MED ORDER — FUROSEMIDE 20 MG PO TABS
20.0000 mg | ORAL_TABLET | Freq: Every day | ORAL | Status: DC
  Administered 2017-01-26: 20 mg via ORAL
  Filled 2017-01-26: qty 1

## 2017-01-26 MED ORDER — ALBUMIN HUMAN 5 % IV SOLN
12.5000 g | Freq: Once | INTRAVENOUS | Status: AC
  Administered 2017-01-26: 12.5 g via INTRAVENOUS
  Filled 2017-01-26: qty 250

## 2017-01-26 NOTE — Progress Notes (Signed)
Plan of care reviewed with patient and a family member at the bedside. Both had many questions regarding patient's hyponatremia and why he is on a low sodium diet when his sodium levels are low. Patient very anxious and addiment that he wants to leave tomorrow (01/27/17) due to a previously scheduled appointment on Friday. RN educated patient on importance of safety for patient and potential complications of hyponatremia if not treated appropriately. Patient's family member very concerned and would like to speak with physician in the AM to get clarification and full understanding of plan of care for the patient.

## 2017-01-26 NOTE — Progress Notes (Signed)
PROGRESS NOTE    Richard Bird  ZOX:[id number] DOB: [birth year] DOA: 01/25/2017 PCP: Juluis Mire, MD    Brief Narrative:  51 yo male, presents with the chief complain of abnormal labs. Recent hospitalization for liver failure and ascites, related to alcoholic liver disease, discharge on diuretics and lactulose. Follow up appointment as outpatient found leukocytosis and hyponatremic. On the initial physical examination, patient was hemodynamically stable. Nonfocal. Abdomen with minimal ascites.    Assessment & Plan:   Principal Problem:   Hyponatremia Active Problems:   Alcoholic cirrhosis of liver with ascites (HCC)   Normocytic anemia   Hepatitis, alcoholic, acute   AKI (acute kidney injury) (HCC)   Leukocytosis   1. Hyponatremia. Suspected hypovolemic hyponatremia, will target a 8 meq Na increased in the first 24 hours, 119. Serum sodium has increased 3 meq in 8 hours, will need to decrease rate of correction. Patient will need 2,75 L of saline for correction, will change fluids to half saline at 75 cc per hour and check renal panel at 1400.   2. Leukocytosis. No signs of infection, likely steroid induced, will follow cell count in am, continue steroid taper, used on last hospitalization for acute alcoholic hepatitis.   3. Alcoholic liver cirrhosis. No significant signs of ascites, will continue close monitoring. No asterixis or encephalopathy. LFTs trending down. Last INR 1,52.   4. Etho abuse. Will continue neuro checks per unit protocol and will add benzodiazepines per protocol. Continue thiamine, folic acid and multivitamins. Positive tremors and generalized anxiety.   5. AKI. Renal function with cr at 1,1 from 1,3 will continue IV fluids, patient with no nausea or vomiting, K at 4,9 and serum bicarbonate at 21.    DVT prophylaxis: scd Code Status: full  Family Communication:  Disposition Plan: home   Consultants:     Procedures:     Antimicrobials:        Subjective: Patient feeling well, no nausea, vomiting, chest pain or dyspnea. No alcohol consumption since last discharge, denies anxiety or tremors.   Objective: Vitals:   01/25/17 2028 01/25/17 2118 01/25/17 2337 01/26/17 0540  BP:  136/70 137/80 (!) 132/59  Pulse:  79 80 82  Resp:  12 18   Temp:   97.6 F (36.4 C) 97.5 F (36.4 C)  TempSrc:   Oral Oral  SpO2: 100% 100% 100% 100%  Weight:   88.7 kg (195 lb 8.8 oz)   Height:   5\' 11"  (1.803 m)    No intake or output data in the 24 hours ending 01/26/17 1235 Filed Weights   01/25/17 1842 01/25/17 2337  Weight: 88.5 kg (195 lb) 88.7 kg (195 lb 8.8 oz)    Examination:  General exam: not in pain or dyspnea, positive anxiety E ENT: mild pallor, positive icterus, oral mucosa dry. Respiratory system: Clear to auscultation. Respiratory effort normal. No wheezing, rales or rhonchi.  Cardiovascular system: S1 & S2 heard, RRR. No JVD, murmurs, rubs, gallops or clicks. No pedal edema. Gastrointestinal system: Abdomen is distended, soft and nontender. No organomegaly or masses felt. Normal bowel sounds heard. Central nervous system: Alert and oriented. No focal neurological deficits. Positive resting tremors.  Extremities: Symmetric 5 x 5 power. Skin: No rashes, lesions or ulcers     Data Reviewed: I have personally reviewed following labs and imaging studies  CBC:  Recent Labs Lab 01/25/17 2012  WBC 36.6*  NEUTROABS 33.9*  HGB 9.2*  HCT 25.2*  MCV 89.4  PLT 283   Basic  Metabolic Panel:  Recent Labs Lab 01/25/17 2012 01/26/17 0407  NA 111* 114*  K 5.3* 4.9  CL 80* 86*  CO2 22 21*  GLUCOSE 107* 131*  BUN 41* 42*  CREATININE 1.34* 1.11  CALCIUM 8.5* 8.1*   GFR: Estimated Creatinine Clearance: 82 mL/min (by C-G formula based on SCr of 1.11 mg/dL). Liver Function Tests:  Recent Labs Lab 01/25/17 2012 01/26/17 0407  AST 95* 72*  ALT 112* 89*  ALKPHOS 249* 205*  BILITOT 7.0* 5.6*  PROT 7.3 5.9*   ALBUMIN 3.1* 2.7*   No results for input(s): LIPASE, AMYLASE in the last 168 hours. No results for input(s): AMMONIA in the last 168 hours. Coagulation Profile: No results for input(s): INR, PROTIME in the last 168 hours. Cardiac Enzymes: No results for input(s): CKTOTAL, CKMB, CKMBINDEX, TROPONINI in the last 168 hours. BNP (last 3 results) No results for input(s): PROBNP in the last 8760 hours. HbA1C: No results for input(s): HGBA1C in the last 72 hours. CBG: No results for input(s): GLUCAP in the last 168 hours. Lipid Profile: No results for input(s): CHOL, HDL, LDLCALC, TRIG, CHOLHDL, LDLDIRECT in the last 72 hours. Thyroid Function Tests: No results for input(s): TSH, T4TOTAL, FREET4, T3FREE, THYROIDAB in the last 72 hours. Anemia Panel: No results for input(s): VITAMINB12, FOLATE, FERRITIN, TIBC, IRON, RETICCTPCT in the last 72 hours. Sepsis Labs: No results for input(s): PROCALCITON, LATICACIDVEN in the last 168 hours.  No results found for this or any previous visit (from the past 240 hour(s)).       Radiology Studies: No results found.      Scheduled Meds: . famotidine  20 mg Oral BID  . folic acid  1 mg Oral Daily  . furosemide  20 mg Oral Daily  . gabapentin  300 mg Oral QHS  . lactulose  30 g Oral BID  . predniSONE  40 mg Oral Q breakfast  . spironolactone  50 mg Oral Daily  . thiamine  100 mg Oral Daily   Continuous Infusions:   LOS: 1 day        Richard Bird Richard Gulaaniel Zerah Hilyer, MD [facility] Pager [phone]  If 7PM-7AM, please contact night-coverage [email or web address] Password [email or web address] 01/26/2017, 12:35 PM

## 2017-01-26 NOTE — H&P (Signed)
History and Physical  Patient Name: Richard Bird     ZOX:[id number]    DOB: 1965-12-20    DOA: 01/25/2017 PCP: Juluis Mire, MD   Patient coming from: Home   Chief Complaint: Abnormal labs  HPI: Richard Bird is a 51 y.o. male with a past medical history significant for recently diagnosed alcoholic cirrhosis with ascites and alcoholic hepatitis who presents with hyponatremia and AKI.  The patient was recently admitted with acute liver failure from alcohol with ascites.  Was started on diuretics and lactulose and prednisone at discharge.  Since then, he has been at home, managing okay.  Yesterday he had an appointment with his PCP and labs were drawn and today he was called and told that his WBC was elevated and his sodium was low and so he came to the ER.  He has felt generalized weakness, dizziness with moving (not with standing/orthostasis), tired, generalized fatigue over the last days to week.  He has had no confusion, nausea.  He has had no falls, seizures.  He has been adherent to his medications.  ED course: -Afebrile, heart rate 85, respirations pulse ox normal, blood pressure 120/69 -Na 111 (from 130 during last hospitalization and 127 2 weeks ago), K 5.3, Cr 1.34 (baseline 0.8), WBC 36.6K, Hgb 9.2 -Bilirubin down to 7 from 13 -AST/ALT 95/112 -He was given small normal saline bolus and TRH was asked to evaluate for hyponatremia and AKI     ROS: Review of Systems  Constitutional: Positive for malaise/fatigue. Negative for chills and fever.  Respiratory: Negative for cough.   Cardiovascular: Negative for chest pain.  Gastrointestinal: Negative for abdominal pain, constipation, diarrhea, nausea and vomiting.  Musculoskeletal: Negative for falls.  Skin: Positive for itching. Negative for rash.  Neurological: Positive for dizziness and weakness. Negative for tingling, tremors, sensory change, speech change, focal weakness, seizures and loss of consciousness.    Psychiatric/Behavioral: Negative for memory loss.  All other systems reviewed and are negative.         Past Medical History:  Diagnosis Date  . Cirrhosis of liver with ascites (HCC) 12/29/2016    Past Surgical History:  Procedure Laterality Date  . HERNIA REPAIR    . KNEE SURGERY      Social History: Patient lives alone.  The patient walks unassisted.  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  He is not a smoker.  He has completely abstained from alcohol.    Allergies  Allergen Reactions  . Penicillins Other (See Comments)    Reaction:  Unknown  Has patient had a PCN reaction causing immediate rash, facial/tongue/throat swelling, SOB or lightheadedness with hypotension: Unsure Has patient had a PCN reaction causing severe rash involving mucus membranes or skin necrosis: Unsure Has patient had a PCN reaction that required hospitalization Unsure Has patient had a PCN reaction occurring within the last 10 years: No If all of the above answers are "NO", then may proceed with Cephalosporin use.    Family history: family history includes Heart disease in his father.  Prior to Admission medications   Medication Sig Start Date End Date Taking? Authorizing Provider  bisacodyl (DULCOLAX) 5 MG EC tablet Take 1 tablet (5 mg total) by mouth daily as needed for moderate constipation. 01/05/17  Yes Hollice Espy, MD  famotidine (PEPCID) 20 MG tablet Take 1 tablet (20 mg total) by mouth 2 (two) times daily. 01/05/17  Yes Hollice Espy, MD  folic acid (FOLVITE) 1 MG tablet Take  1 tablet (1 mg total) by mouth daily. 01/06/17  Yes Hollice EspyKrishnan, Sendil K, MD  furosemide (LASIX) 40 MG tablet Take 1 tablet (40 mg total) by mouth daily. 01/06/17  Yes Hollice EspyKrishnan, Sendil K, MD  gabapentin (NEURONTIN) 300 MG capsule Take 300 mg by mouth at bedtime.   Yes [provider]  hydrOXYzine (ATARAX/VISTARIL) 25 MG tablet Take 25 mg by mouth 3 (three) times daily as needed for itching.   Yes  [provider]  lactulose (CHRONULAC) 10 GM/15ML solution Take 45 mLs (30 g total) by mouth 2 (two) times daily. 01/05/17  Yes Hollice EspyKrishnan, Sendil K, MD  Multiple Vitamin (MULTIVITAMIN WITH MINERALS) TABS tablet Take 1 tablet by mouth daily. 01/06/17  Yes Hollice EspyKrishnan, Sendil K, MD  potassium chloride SA (K-DUR,KLOR-CON) 20 MEQ tablet Take 1 tablet (20 mEq total) by mouth 2 (two) times daily. 01/05/17  Yes Hollice EspyKrishnan, Sendil K, MD  predniSONE (DELTASONE) 5 MG tablet Take 10-40 mg by mouth daily with breakfast. Pt is to take as a taper:  8 tablets daily for five weeks,  6 tablets daily for one week, 4 tablets daily for one week, 2 tablets daily for one week, then STOP. 01/13/17 03/10/17 Yes [provider]  spironolactone (ALDACTONE) 100 MG tablet Take 1 tablet (100 mg total) by mouth daily. 01/06/17  Yes Hollice EspyKrishnan, Sendil K, MD  thiamine 100 MG tablet Take 1 tablet (100 mg total) by mouth daily. 01/06/17  Yes Hollice EspyKrishnan, Sendil K, MD       Physical Exam: BP (!) 132/59 (BP Location: Left Arm)   Pulse 82   Temp 97.5 F (36.4 C) (Oral)   Resp 18   Ht 5\' 11"  (1.803 m)   Wt 88.7 kg (195 lb 8.8 oz)   SpO2 100%   BMI 27.27 kg/m  General appearance: Thin pale adult male, alert and in no acute distress.   Eyes: Icteric, conjunctiva pink, lids and lashes normal. PERRL.    ENT: No nasal deformity, discharge, epistaxis.  Hearing normal. OP tacky dry without lesions.   Neck: No neck masses.  Trachea midline.  No thyromegaly/tenderness. Lymph: No cervical or supraclavicular lymphadenopathy. Skin: Warm and dry.  Mild jaundice.  No suspicious rashes or lesions. Cardiac: RRR, nl S1-S2, no murmurs appreciated.  Capillary refill is brisk.  JVP normal.  No LE edema.  Radial and DP pulses 2+ and symmetric. Respiratory: Normal respiratory rate and rhythm.  CTAB without rales or wheezes. Abdomen: Abdomen soft.  No TTP. Minimal ascites.   MSK: No deformities or effusions.  No cyanosis or clubbing. Neuro:  Cranial nerves normal.  Sensation intact to light touch. Speech is fluent.  Muscle strength normal.    Psych: Sensorium intact and responding to questions, attention slightly delayed, mild psychomotor slowing.  Behavior appropriate.  Affect blunted.  Judgment and insight appear normal.     Labs on Admission:  I have personally reviewed following labs and imaging studies: CBC:  Recent Labs Lab 01/25/17 2012  WBC 36.6*  NEUTROABS 33.9*  HGB 9.2*  HCT 25.2*  MCV 89.4  PLT 283   Basic Metabolic Panel:  Recent Labs Lab 01/25/17 2012 01/26/17 0407  NA 111* 114*  K 5.3* 4.9  CL 80* 86*  CO2 22 21*  GLUCOSE 107* 131*  BUN 41* 42*  CREATININE 1.34* 1.11  CALCIUM 8.5* 8.1*   GFR: Estimated Creatinine Clearance: 82 mL/min (by C-G formula based on SCr of 1.11 mg/dL).  Liver Function Tests:  Recent Labs Lab 01/25/17 2012  01/26/17 0407  AST 95* 72*  ALT 112* 89*  ALKPHOS 249* 205*  BILITOT 7.0* 5.6*  PROT 7.3 5.9*  ALBUMIN 3.1* 2.7*   No results for input(s): LIPASE, AMYLASE in the last 168 hours. No results for input(s): AMMONIA in the last 168 hours. Coagulation Profile: No results for input(s): INR, PROTIME in the last 168 hours. Cardiac Enzymes: No results for input(s): CKTOTAL, CKMB, CKMBINDEX, TROPONINI in the last 168 hours. BNP (last 3 results) No results for input(s): PROBNP in the last 8760 hours. HbA1C: No results for input(s): HGBA1C in the last 72 hours. CBG: No results for input(s): GLUCAP in the last 168 hours. Lipid Profile: No results for input(s): CHOL, HDL, LDLCALC, TRIG, CHOLHDL, LDLDIRECT in the last 72 hours. Thyroid Function Tests: No results for input(s): TSH, T4TOTAL, FREET4, T3FREE, THYROIDAB in the last 72 hours. Anemia Panel: No results for input(s): VITAMINB12, FOLATE, FERRITIN, TIBC, IRON, RETICCTPCT in the last 72 hours. Sepsis Labs:  Invalid input(s): PROCALCITONIN, LACTICIDVEN No results found for this or any previous visit  (from the past 240 hour(s)).          Assessment/Plan  1. Hyponatremia:  Asymptomatic, clinically sounds gradual over last 1-2 weeks.  Hypervolemic, perhaps some degree of overdiuresis.  No obvious offending meds.   -Give albumin  -Reduce diuretics -Strict I/Os -Check UA, urine Na, osmolality and urine Osm   2. Acute kidney injury:  -Check UA -Reduce diuretics -Give albumin   3. Alcoholic cirrhosis with hepatitis:  -Continue thiamine and folate -Continue prednisone 40 mg daily until the end of May  4. Leukocytosis:  Suspect from prednisone  5. Other medications:  -Continue Pepcid -Continue bisacodyl -Continue Atarax for itching -Continue gabapentin  6. Normocytic anemia:  Stable, baseline 9-10           DVT prophylaxis: SCDs  Code Status: FULL  Family Communication: Daughter and ex-wife at bedside  Disposition Plan: Anticipate urine studies and monitor sodium.  Albumin and monitor creatinine tomorrow. Consults called: None overnight Admission status: INPATIENT         Medical decision making: Patient seen at 10:45 PM on 01/25/2017.  The patient was discussed with Dr. Freida Busman.  What exists of the patient's chart was reviewed in depth and summarized above.  Clinical condition: stable.        Alberteen Sam [facility] Pager [phone]

## 2017-01-27 DIAGNOSIS — F10239 Alcohol dependence with withdrawal, unspecified: Secondary | ICD-10-CM

## 2017-01-27 LAB — BASIC METABOLIC PANEL
ANION GAP: 4 — AB (ref 5–15)
ANION GAP: 5 (ref 5–15)
BUN: 33 mg/dL — ABNORMAL HIGH (ref 6–20)
BUN: 32 mg/dL — ABNORMAL HIGH (ref 6–20)
CALCIUM: 8.3 mg/dL — AB (ref 8.9–10.3)
CO2: 21 mmol/L — ABNORMAL LOW (ref 22–32)
CO2: 21 mmol/L — AB (ref 22–32)
CREATININE: 1.06 mg/dL (ref 0.61–1.24)
Calcium: 8.3 mg/dL — ABNORMAL LOW (ref 8.9–10.3)
Chloride: 94 mmol/L — ABNORMAL LOW (ref 101–111)
Chloride: 94 mmol/L — ABNORMAL LOW (ref 101–111)
Creatinine, Ser: 1.16 mg/dL (ref 0.61–1.24)
GFR calc Af Amer: >60 mL/min (ref >60)
GFR calc Af Amer: >60 mL/min (ref >60)
GFR calc non Af Amer: >60 mL/min (ref >60)
GLUCOSE: 135 mg/dL — AB (ref 65–99)
GLUCOSE: 146 mg/dL — AB (ref 65–99)
POTASSIUM: 4.4 mmol/L (ref 3.5–5.1)
Potassium: 5.1 mmol/L (ref 3.5–5.1)
Sodium: 119 mmol/L — CL (ref 135–145)
Sodium: 120 mmol/L — ABNORMAL LOW (ref 135–145)

## 2017-01-27 LAB — GLUCOSE, CAPILLARY
GLUCOSE-CAPILLARY: 106 mg/dL — AB (ref 65–99)
Glucose-Capillary: 119 mg/dL — ABNORMAL HIGH (ref 65–99)
Glucose-Capillary: 159 mg/dL — ABNORMAL HIGH (ref 65–99)

## 2017-01-27 LAB — CBC WITH DIFFERENTIAL/PLATELET
BASOS ABS: 0 10*3/uL (ref 0.0–0.1)
Basophils Relative: 0 %
Eosinophils Absolute: 0.9 10*3/uL — ABNORMAL HIGH (ref 0.0–0.7)
Eosinophils Relative: 3 %
HCT: 22.6 % — ABNORMAL LOW (ref 39.0–52.0)
HEMOGLOBIN: 8.1 g/dL — AB (ref 13.0–17.0)
LYMPHS PCT: 8 %
Lymphs Abs: 2.4 10*3/uL (ref 0.7–4.0)
MCH: 32.9 pg (ref 26.0–34.0)
MCHC: 35.8 g/dL (ref 30.0–36.0)
MCV: 91.9 fL (ref 78.0–100.0)
MONOS PCT: 2 %
Monocytes Absolute: 0.6 10*3/uL (ref 0.1–1.0)
Neutro Abs: 25.6 10*3/uL — ABNORMAL HIGH (ref 1.7–7.7)
Neutrophils Relative %: 87 %
Platelets: 222 10*3/uL (ref 150–400)
RBC: 2.46 MIL/uL — ABNORMAL LOW (ref 4.22–5.81)
RDW: 14.3 % (ref 11.5–15.5)
WBC MORPHOLOGY: INCREASED
WBC: 29.5 10*3/uL — ABNORMAL HIGH (ref 4.0–10.5)

## 2017-01-27 LAB — UREA NITROGEN, URINE: UREA NITROGEN UR: 535 mg/dL

## 2017-01-27 LAB — AMMONIA: Ammonia: 23 umol/L (ref 9–35)

## 2017-01-27 MED ORDER — LORAZEPAM 1 MG PO TABS
0.0000 mg | ORAL_TABLET | Freq: Three times a day (TID) | ORAL | Status: DC
  Administered 2017-01-28: 1 mg via ORAL
  Filled 2017-01-27: qty 1

## 2017-01-27 MED ORDER — SODIUM CHLORIDE 0.45 % IV SOLN
INTRAVENOUS | Status: DC
  Administered 2017-01-27: 10:00:00 via INTRAVENOUS

## 2017-01-27 MED ORDER — LORAZEPAM 2 MG/ML IJ SOLN: 0.5000 mg | Freq: Four times a day (QID) | INTRAMUSCULAR | Status: DC | PRN

## 2017-01-27 MED ORDER — LORAZEPAM 0.5 MG PO TABS: 0.5000 mg | ORAL_TABLET | Freq: Four times a day (QID) | ORAL | Status: DC | PRN

## 2017-01-27 MED ORDER — LORAZEPAM 1 MG PO TABS: 0.0000 mg | ORAL_TABLET | Freq: Two times a day (BID) | ORAL | Status: DC

## 2017-01-27 NOTE — Progress Notes (Addendum)
PROGRESS NOTE    Richard InglesDaniel Bird  AVW:[id number] DOB: 08-Feb-1966 DOA: 01/25/2017 PCP: Juluis MireJennings, William, MD    Brief Narrative: 51 yo male, presents with the chief complain of abnormal labs. Recent hospitalization for liver failure and ascites, related to alcoholic liver disease, discharge on diuretics and lactulose. Follow up appointment as outpatient found leukocytosis and hyponatremic. On the initial physical examination, patient was hemodynamically stable. Nonfocal. Abdomen with minimal ascites. Serum sodium found to be very low at 111, admitted for electrolyte correction. Patient has developed alcohol withdrawal symptoms, placed on benzodiazepines per protocol. Slowly correcting serum Na.    Assessment & Plan:   Principal Problem:   Hyponatremia Active Problems:   Alcoholic cirrhosis of liver with ascites (HCC)   Normocytic anemia   Hepatitis, alcoholic, acute   AKI (acute kidney injury) (HCC)   Leukocytosis    1. Hyponatremia. Serum sodium at target this am 119, cw 8 meq increase from admission. Will change fluids to 0.45% saline at 75 ml per hour and will check renal panel in am, new target 127. Once serum Na above 120, the risk of pontine myelinolysis is low. Will check BMP this pm at 1600.     2. Leukocytosis. Suspected to be steroid induced, no signs of systemic infection, will follow cell count in am, continue to hold on antibiotic therapy.   3. Alcoholic liver cirrhosis. Ammonia at 23, will continue home regimen of lactulose, no signs of significant ascites. Continue supportive medical care. Continue prednisone taper, for recent acute alcoholic hepatitis.   4. Etho abuse with acute withdrawal symptoms. Patient with signs of acute withdrawal, had  5 mg of lorazepam over last 24 hours, will continue neuro checks, multivitamins including folate and thiamine. Fall precautions and physical therapy evaluation.   5. AKI. Serum cr at 1.16 with K at 4.4 and serum bicarbonate at  21. Will continue hydration with half normal saline, follow on renal panel in am. Avoid hypotension or nephrotoxic medications.   Late entry, patient oversedated, will decrease by 50% dose of lorazepam and continue close monitoring. I discussed patient's condition with her daughter at the bedside and all questions were addressed.   DVT prophylaxis: scd Code Status: full  Family Communication:  Disposition Plan: home    Consultants:     Procedures:      Antimicrobials:       Subjective: Patient developed withdrawal symptoms, had benzodiazepines overnight. No nausea or vomiting. No fever or chills. Patient required dextrose infusion to avoid rapid overcorrection of hyponatremia.    Objective: Vitals:   01/26/17 2227 01/27/17 0053 01/27/17 0650 01/27/17 1100  BP: 136/79 (!) 146/84 117/81 124/79  Pulse: 95 94 95 100  Resp: 20 20 18  (!) 24  Temp: 97.3 F (36.3 C)  98.3 F (36.8 C)   TempSrc: Axillary  Oral   SpO2: 100% 100% 100% 100%  Weight:      Height:        Intake/Output Summary (Last 24 hours) at 01/27/17 1153 Last data filed at 01/27/17 0600  Gross per 24 hour  Intake          1403.75 ml  Output                0 ml  Net          1403.75 ml   Filed Weights   01/25/17 1842 01/25/17 2337  Weight: 88.5 kg (195 lb) 88.7 kg (195 lb 8.8 oz)    Examination:  General exam: deconditioned  and ill looking appearing E ENT: mild icterus and pallor, oral mucosa moist.  Respiratory system: Clear to auscultation. Respiratory effort normal. No wheezing, rales or rhonchi.  Cardiovascular system: S1 & S2 heard, RRR. No JVD, murmurs, rubs, gallops or clicks. No pedal edema. Gastrointestinal system: Abdomen is distended, soft and nontender. No organomegaly or masses felt. Normal bowel sounds heard. Central nervous system: Somnolent and confused, non focal, no agitation.  Extremities: Symmetric 5 x 5 power. Skin: No rashes, lesions or ulcers  Data Reviewed: I have  personally reviewed following labs and imaging studies  CBC:  Recent Labs Lab 01/25/17 2012 01/27/17 0113  WBC 36.6* 29.5*  NEUTROABS 33.9* 25.6*  HGB 9.2* 8.1*  HCT 25.2* 22.6*  MCV 89.4 91.9  PLT 283 222   Basic Metabolic Panel:  Recent Labs Lab 01/25/17 2012 01/26/17 0407 01/26/17 1413 01/26/17 2007 01/27/17 0113  NA 111* 114* 119* 119* 119*  K 5.3* 4.9 4.9 4.8 4.4  CL 80* 86* 94* 93* 94*  CO2 22 21* 18* 19* 21*  GLUCOSE 107* 131* 149* 156* 135*  BUN 41* 42* 37* 35* 33*  CREATININE 1.34* 1.11 1.03 1.24 1.16  CALCIUM 8.5* 8.1* 8.3* 8.4* 8.3*   GFR: Estimated Creatinine Clearance: 78.4 mL/min (by C-G formula based on SCr of 1.16 mg/dL). Liver Function Tests:  Recent Labs Lab 01/25/17 2012 01/26/17 0407  AST 95* 72*  ALT 112* 89*  ALKPHOS 249* 205*  BILITOT 7.0* 5.6*  PROT 7.3 5.9*  ALBUMIN 3.1* 2.7*   No results for input(s): LIPASE, AMYLASE in the last 168 hours.  Recent Labs Lab 01/27/17 0113  AMMONIA 23   Coagulation Profile: No results for input(s): INR, PROTIME in the last 168 hours. Cardiac Enzymes: No results for input(s): CKTOTAL, CKMB, CKMBINDEX, TROPONINI in the last 168 hours. BNP (last 3 results) No results for input(s): PROBNP in the last 8760 hours. HbA1C: No results for input(s): HGBA1C in the last 72 hours. CBG:  Recent Labs Lab 01/27/17 0743  GLUCAP 106*   Lipid Profile: No results for input(s): CHOL, HDL, LDLCALC, TRIG, CHOLHDL, LDLDIRECT in the last 72 hours. Thyroid Function Tests: No results for input(s): TSH, T4TOTAL, FREET4, T3FREE, THYROIDAB in the last 72 hours. Anemia Panel: No results for input(s): VITAMINB12, FOLATE, FERRITIN, TIBC, IRON, RETICCTPCT in the last 72 hours. Sepsis Labs: No results for input(s): PROCALCITON, LATICACIDVEN in the last 168 hours.  No results found for this or any previous visit (from the past 240 hour(s)).       Radiology Studies: No results found.      Scheduled  Meds: . famotidine  20 mg Oral BID  . folic acid  1 mg Oral Daily  . gabapentin  300 mg Oral QHS  . lactulose  30 g Oral BID  . LORazepam  0-4 mg Oral Q6H   Followed by  . [START ON 01/28/2017] LORazepam  0-4 mg Oral Q12H  . multivitamin with minerals  1 tablet Oral Daily  . predniSONE  40 mg Oral Q breakfast  . thiamine  100 mg Oral Daily   Or  . thiamine  100 mg Intravenous Daily   Continuous Infusions: . sodium chloride 75 mL/hr at 01/27/17 1026     LOS: 2 days        Mauricio Annett Gula, MD [facility] Pager [phone]  If 7PM-7AM, please contact night-coverage [email or web address] Password [email or web address] 01/27/2017, 11:53 AM

## 2017-01-27 NOTE — Progress Notes (Signed)
Text paged Schorr: "[phone] Newcomer[address]illespie: pt new onset confusion. Disorient to time and place. CIWA 13."

## 2017-01-27 NOTE — Clinical Social Work Note (Signed)
Clinical Social Work Assessment  Patient Details  Name: Richard Bird MRN: [id number] Date of Birth: 05-15-1966  Date of referral:  01/27/17               Reason for consult:  Facility Placement, Discharge Planning                Permission sought to share information with:  Family Supports Permission granted to share information::     Name::     Daughter [name] [phone]  Agency::     Relationship::     Contact Information:     Housing/Transportation Living arrangements for the past 2 months:  Apartment Source of Information:  Adult Children, Medical Team Patient Interpreter Needed:  None Criminal Activity/Legal Involvement Pertinent to Current Situation/Hospitalization:  No - Comment as needed Significant Relationships:  Adult Children Lives with:  Self Do you feel safe going back to the place where you live?  Yes Need for family participation in patient care:  Yes (Comment) (daughter involved in decision-making)  Care giving concerns:  Pt from home where he resides alone. At baseline ambulates and performs ADLs independently. Last month went to rehab at [facility] per daughter and was DC's "after 4 days bc he was doing so well, didn't need therapy." Daughter states is hopeful that pt will return to baseline with mobility while in hospital but is in agreement that if pt still needing assistance, SNF may be needed.   Social Worker assessment / plan:  CSW consulted for potential SNF placement.   Met with pt at bedside, however pt sleeping and has been confused per staff, CSW unable to engage him. Spoke with daughter, who states she is open to SNF if needed at DC but asks that CSW not proceed with referrals until she has spoken with medical team re: pt's progress and plan of care. Is hopeful pt will not need SNF at time of DC but is understanding.  Pt uninsured, daughter states private pay is option for SNF. Preference is [facility].  Plan: Will follow and refer to SNF if  decided on by pt/daughter.   Employment status:    Insurance information:   Licensed conveyancer (medicaid pending)) PT Recommendations:  [place], 24 Hour Supervision Information / Referral to community resources:     Patient/Family's Response to care:  UTA pt's response- daughter appreciative of care.   Patient/Family's Understanding of and Emotional Response to Diagnosis, Current Treatment, and Prognosis:  UTA pt's understanding as he was sleeping at time of assessment. Daughter demonstrates understanding of potential DC plans.   Emotional Assessment Appearance:  Appears stated age Attitude/Demeanor/Rapport:   (sleeping) Affect (typically observed):   (sleeping) Orientation:   (UTA sleeping) Alcohol / Substance use:  Alcohol Use Psych involvement (Current and /or in the community):  No (Comment)  Discharge Needs  Concerns to be addressed:  Discharge Planning Concerns Readmission within the last 30 days:  Yes Current discharge risk:  Lives alone Barriers to Discharge:  Continued Medical Work up   [employer], LCSW 01/27/2017, 1:55 PM

## 2017-01-27 NOTE — Progress Notes (Signed)
Patient started having new onset confusion around 0035 (01/27/17) with disorientation to time and place. K. Schorr notified of change and was advised to administer 2mg  PO Ativan due to CIWA being 13; Ammonia level ordered as well which was 23. During the rest of the shift, patient became increasingly unsteady when ambulating with difficulty following commands; he is now a 2-assist. BSC placed for safety with bed alarm set. Patient continues to be disoriented to time and drowsy. Vital signs stable. Patient's family member at bedside. Call bell in reach. Will continue to monitor for changes.

## 2017-01-27 NOTE — Evaluation (Signed)
Physical Therapy Evaluation Patient Details Name: Richard Bird MRN: [id number] DOB: 11/01/1965 Today's Date: 01/27/2017   History of Present Illness  51 yo male admitted with hyponatremia. Hx of ETOH dep, ETOH cirrhosis.   Clinical Impression  On eval, pt required Min assist +2 (for safety)  for mobility. He walked ~500 feet with a RW and ~15 feet without any assistive device. He is very unsteady and at risk for falls currently. He demonstrated poor safety awareness and had some difficulty following multi-step commands. No family present during session. Will continue to follow and progress activity as able. Recommendation, at this time, is for SNF. If pt progress well and demonstrates improved safety awareness, he may be able to return home.     Follow Up Recommendations SNF;Supervision/Assistance - 24 hour (depending on progress. )    Equipment Recommendations  None recommended by PT    Recommendations for Other Services       Precautions / Restrictions Precautions Precautions: Fall Restrictions Weight Bearing Restrictions: No      Mobility  Bed Mobility Overal bed mobility: Needs Assistance Bed Mobility: Supine to Sit     Supine to sit: Supervision     General bed mobility comments: for safety  Transfers Overall transfer level: Needs assistance Equipment used: Rolling walker (2 wheeled) Transfers: Sit to/from Stand Sit to Stand: Min assist;+2 safety/equipment         General transfer comment: Unsteady. Impulsive and uncontrolled. Assist needed to stabilize.   Ambulation/Gait Ambulation/Gait assistance: Min assist;+2 safety/equipment Ambulation Distance (Feet): 500 Feet Assistive device: Rolling walker (2 wheeled) Gait Pattern/deviations: Step-through pattern;Decreased stride length;Staggering left;Staggering right;Drifts right/left     General Gait Details: Unsteady even with use of RW. Uncontrolled. Difficulty maintaining straight path with walker.  Intermittently collided with objects in the environment. Walked ~15 feet in room without a device-Min assist needed.   Stairs            Wheelchair Mobility    Modified Rankin (Stroke Patients Only)       Balance Overall balance assessment: Needs assistance   Sitting balance-Leahy Scale: Good       Standing balance-Leahy Scale: Poor                               Pertinent Vitals/Pain Pain Assessment: No/denies pain    Home Living Family/patient expects to be discharged to:: Skilled nursing facility Living Arrangements: Alone   Type of Home: Apartment Home Access: Stairs to enter   Entrance Stairs-Number of Steps: 3 Home Layout: One level Home Equipment: None      Prior Function Level of Independence: Independent               Hand Dominance        Extremity/Trunk Assessment   Upper Extremity Assessment Upper Extremity Assessment: Overall WFL for tasks assessed    Lower Extremity Assessment Lower Extremity Assessment: Generalized weakness    Cervical / Trunk Assessment Cervical / Trunk Assessment: Normal  Communication   Communication: No difficulties  Cognition Arousal/Alertness: Awake/alert Behavior During Therapy: WFL for tasks assessed/performed Overall Cognitive Status: Impaired/Different from baseline Area of Impairment: Safety/judgement;Following commands                       Following Commands: Follows multi-step commands inconsistently Safety/Judgement: Decreased awareness of deficits            General Comments  Exercises     Assessment/Plan    PT Assessment Patient needs continued PT services  PT Problem List Decreased strength;Decreased mobility;Decreased balance;Decreased knowledge of use of DME       PT Treatment Interventions DME instruction;Gait training;Therapeutic activities;Therapeutic exercise;Patient/family education;Balance training;Functional mobility training    PT Goals  (Current goals can be found in the Care Plan section)  Acute Rehab PT Goals Patient Stated Goal: regain PLOF PT Goal Formulation: With patient Time For Goal Achievement: 02/10/17 Potential to Achieve Goals: Good    Frequency Min 3X/week   Barriers to discharge        Co-evaluation               AM-PAC PT "6 Clicks" Daily Activity  Outcome Measure Difficulty turning over in bed (including adjusting bedclothes, sheets and blankets)?: A Little Difficulty moving from lying on back to sitting on the side of the bed? : A Little Difficulty sitting down on and standing up from a chair with arms (e.g., wheelchair, bedside commode, etc,.)?: A Little Help needed moving to and from a bed to chair (including a wheelchair)?: A Little Help needed walking in hospital room?: A Little Help needed climbing 3-5 steps with a railing? : A Little 6 Click Score: 18    End of Session Equipment Utilized During Treatment: Gait belt Activity Tolerance: Patient tolerated treatment well Patient left: in chair;with call bell/phone within reach;with chair alarm set   PT Visit Diagnosis: Muscle weakness (generalized) (M62.81);Difficulty in walking, not elsewhere classified (R26.2)    Time: [id number] PT Time Calculation (min) (ACUTE ONLY): 15 min   Charges:   PT Evaluation $PT Eval Low Complexity: 1 Procedure     PT G Codes:        Rebeca Alertorter, Klarisa Barman [facility] 01/27/2017, 10:46 AM

## 2017-01-28 LAB — CBC WITH DIFFERENTIAL/PLATELET
BASOS ABS: 0 10*3/uL (ref 0.0–0.1)
Basophils Relative: 0 %
EOS ABS: 0.6 10*3/uL (ref 0.0–0.7)
EOS PCT: 3 %
HCT: 23.4 % — ABNORMAL LOW (ref 39.0–52.0)
Hemoglobin: 7.9 g/dL — ABNORMAL LOW (ref 13.0–17.0)
LYMPHS ABS: 1.2 10*3/uL (ref 0.7–4.0)
Lymphocytes Relative: 5 %
MCH: 31.7 pg (ref 26.0–34.0)
MCHC: 33.8 g/dL (ref 30.0–36.0)
MCV: 94 fL (ref 78.0–100.0)
Monocytes Absolute: 0.8 10*3/uL (ref 0.1–1.0)
Monocytes Relative: 3 %
Neutro Abs: 20.2 10*3/uL — ABNORMAL HIGH (ref 1.7–7.7)
Neutrophils Relative %: 89 %
PLATELETS: 206 10*3/uL (ref 150–400)
RBC: 2.49 MIL/uL — AB (ref 4.22–5.81)
RDW: 14.2 % (ref 11.5–15.5)
WBC: 22.7 10*3/uL — AB (ref 4.0–10.5)

## 2017-01-28 LAB — BASIC METABOLIC PANEL
Anion gap: 6 (ref 5–15)
Anion gap: 6 (ref 5–15)
BUN: 28 mg/dL — AB (ref 6–20)
BUN: 24 mg/dL — AB (ref 6–20)
CALCIUM: 8.3 mg/dL — AB (ref 8.9–10.3)
CO2: 21 mmol/L — ABNORMAL LOW (ref 22–32)
CO2: 21 mmol/L — ABNORMAL LOW (ref 22–32)
CREATININE: 0.96 mg/dL (ref 0.61–1.24)
Calcium: 8.5 mg/dL — ABNORMAL LOW (ref 8.9–10.3)
Chloride: 93 mmol/L — ABNORMAL LOW (ref 101–111)
Chloride: 96 mmol/L — ABNORMAL LOW (ref 101–111)
Creatinine, Ser: 0.99 mg/dL (ref 0.61–1.24)
GFR calc Af Amer: >60 mL/min (ref >60)
GFR calc Af Amer: >60 mL/min (ref >60)
Glucose, Bld: 119 mg/dL — ABNORMAL HIGH (ref 65–99)
Glucose, Bld: 128 mg/dL — ABNORMAL HIGH (ref 65–99)
POTASSIUM: 4.7 mmol/L (ref 3.5–5.1)
POTASSIUM: 4.9 mmol/L (ref 3.5–5.1)
SODIUM: 120 mmol/L — AB (ref 135–145)
SODIUM: 123 mmol/L — AB (ref 135–145)

## 2017-01-28 LAB — AMMONIA: AMMONIA: 19 umol/L (ref 9–35)

## 2017-01-28 MED ORDER — SODIUM CHLORIDE 0.9 % IV SOLN
INTRAVENOUS | Status: DC
  Administered 2017-01-28: 10:00:00 via INTRAVENOUS
  Administered 2017-01-29 (×2): 1000 mL via INTRAVENOUS

## 2017-01-28 MED ORDER — LORAZEPAM 0.5 MG PO TABS
0.5000 mg | ORAL_TABLET | Freq: Two times a day (BID) | ORAL | Status: DC
  Administered 2017-01-28: 0.5 mg via ORAL
  Filled 2017-01-28 (×2): qty 1

## 2017-01-28 MED ORDER — LORAZEPAM 0.5 MG PO TABS
0.5000 mg | ORAL_TABLET | Freq: Three times a day (TID) | ORAL | Status: AC
  Administered 2017-01-28: 0.5 mg via ORAL
  Filled 2017-01-28: qty 1

## 2017-01-28 NOTE — Progress Notes (Signed)
Physical Therapy Treatment Patient Details Name: Richard Bird MRN: [id number] DOB: 1966-04-02 Today's Date: 01/28/2017    History of Present Illness 51 yo male admitted with hyponatremia. Hx of ETOH dep, ETOH cirrhosis.     PT Comments    Son present during session and not aware of pt's current ETOH  consumption.  "he quit drinking a long time ago" stated son  Assisted with amb pt a great distance in hallway with and without AD.  Very unsteady gait both ways with LOB x 5 (therapist recovered"  Pt and son blame L knee problems however instability more neurological/ataxia/delayed corrective response.  Perfomed BERG balance test which pt scored low 23/56.  HIGH FALL RISK.  Pt should NOT amb by himself even with RW he requires assist to safely navigate.  Pt will need ST Rehab at SNF.   Follow Up Recommendations  SNF;Supervision/Assistance - 24 hour     Equipment Recommendations  None recommended by PT    Recommendations for Other Services       Precautions / Restrictions Precautions Precaution Comments: "bad L knee" wear a brace but left it at home Restrictions Weight Bearing Restrictions: No    Mobility  Bed Mobility               General bed mobility comments: OOB  Transfers Overall transfer level: Needs assistance Equipment used: None;Rolling walker (2 wheeled) Transfers: Sit to/from [employer] Sit to Stand: Min assist;+2 safety/equipment Stand pivot transfers: Min assist      Lateral/Scoot Transfers: Min assist;Mod assist General transfer comment: Unsteady. Impulsive and uncontrolled. Assist needed to stabilize.   Ambulation/Gait Ambulation/Gait assistance: Min assist;Mod assist Ambulation Distance (Feet): 350 Feet Assistive device: Rolling walker (2 wheeled);None Gait Pattern/deviations: Step-through pattern;Decreased stride length;Staggering left;Staggering right;Drifts right/left Gait velocity: decreased   General Gait Details:  Unsteady even with use of RW. Uncontrolled. Difficulty maintaining straight path with walker. Intermittently collided with objects in the environment. Walked ~15 feet in room without a device-Mod assist needed.    Stairs            Wheelchair Mobility    Modified Rankin (Stroke Patients Only)       Balance Overall balance assessment: Needs assistance Sitting-balance support: Feet supported;Bilateral upper extremity supported Sitting balance-Leahy Scale: Good   Postural control: Posterior lean   Standing balance-Leahy Scale: Poor Standing balance comment: heavy posterior bias in standing                 Standardized Balance Assessment Standardized Balance Assessment : Berg Balance Test Berg Balance Test Sit to Stand: Able to stand without using hands and stabilize independently Standing Unsupported: Able to stand 30 seconds unsupported Sitting with Back Unsupported but Feet Supported on Floor or Stool: Able to sit safely and securely 2 minutes Stand to Sit: Controls descent by using hands Transfers: Able to transfer with verbal cueing and /or supervision Standing Unsupported with Eyes Closed: Able to stand 3 seconds Standing Ubsupported with Feet Together: Needs help to attain position and unable to hold for 15 seconds From Standing, Reach Forward with Outstretched Arm: Reaches forward but needs supervision From Standing Position, Pick up Object from Floor: Able to pick up shoe, needs supervision From Standing Position, Turn to Look Behind Over each Shoulder: Needs supervision when turning Turn 360 Degrees: Needs close supervision or verbal cueing Standing Unsupported, Alternately Place Feet on Step/Stool: Needs assistance to keep from falling or unable to try Standing Unsupported, One Foot in Front: Loses balance  while stepping or standing Standing on One Leg: Unable to try or needs assist to prevent fall Total Score: 23/56 Indicating HIGH FALL RISK and need for AD  but with pt's impaired cognition and ataxic MVTS do NOT rec pt amb by himself         Cognition Arousal/Alertness: Awake/alert   Overall Cognitive Status: Impaired/Different from baseline Area of Impairment: Safety/judgement;Following commands                       Following Commands: Follows multi-step commands inconsistently Safety/Judgement: Decreased awareness of deficits     General Comments: required increased time thought process and following commands inconsistantly      Exercises      General Comments General comments (skin integrity, edema, etc.): 23/56 indicating HIGH FALL RISK and need for AD and Supervision due to impaired cognition to use AD correctly/safely      Pertinent Vitals/Pain Pain Assessment: Faces Faces Pain Scale: Hurts a little bit Pain Location: L knee Pain Descriptors / Indicators: Aching Pain Intervention(s): Monitored during session    Home Living                      Prior Function            PT Goals (current goals can now be found in the care plan section) Progress towards PT goals: Progressing toward goals    Frequency    Min 3X/week      PT Plan Current plan remains appropriate    Co-evaluation              AM-PAC PT "6 Clicks" Daily Activity  Outcome Measure  Difficulty turning over in bed (including adjusting bedclothes, sheets and blankets)?: A Little Difficulty moving from lying on back to sitting on the side of the bed? : A Little Difficulty sitting down on and standing up from a chair with arms (e.g., wheelchair, bedside commode, etc,.)?: A Little Help needed moving to and from a bed to chair (including a wheelchair)?: A Little Help needed walking in hospital room?: A Little Help needed climbing 3-5 steps with a railing? : A Little 6 Click Score: 18    End of Session Equipment Utilized During Treatment: Gait belt Activity Tolerance: Patient tolerated treatment well Patient left: in  bed;with call bell/phone within reach;with family/visitor present Nurse Communication: Mobility status PT Visit Diagnosis: Muscle weakness (generalized) (M62.81);Difficulty in walking, not elsewhere classified (R26.2)     Time:  -  9:45 - 10:10    Charges:  $Gait Training: 8-22 mins $Therapeutic Activity: 8-22 mins                    G Codes:       {Rush Salce  PTA [facility]  Acute  Rehab Pager      [phone]

## 2017-01-28 NOTE — Progress Notes (Signed)
PROGRESS NOTE    Vanita InglesDaniel Brickman  WUJ:[id number] DOB: 16-Jun-1966 DOA: 01/25/2017 PCP: Juluis MireJennings, William, MD   Brief Narrative:  51 yo male, presents with the chief complain of abnormal labs. Recent hospitalization for liver failure and ascites, related to alcoholic liver disease, discharge on diuretics and lactulose. Follow up appointment as outpatient found leukocytosis and hyponatremic. On the initial physical examination, patient was hemodynamically stable. Nonfocal. Abdomen with minimal ascites. Serum sodium found to be very low at 111, admitted for electrolyte correction. Patient has developed alcohol withdrawal symptoms, placed on benzodiazepines per protocol. Slowly correcting serum Na.     Assessment & Plan:   Principal Problem:   Hyponatremia Active Problems:   Alcoholic cirrhosis of liver with ascites (HCC)   Normocytic anemia   Hepatitis, alcoholic, acute   AKI (acute kidney injury) (HCC)   Leukocytosis   #1 severe hyponatremia Questionably etiology. Likely secondary to hypovolemic hyponatremia vs secondary to ETOH. Sodium level at 120. Sodium level at 111 on admission. Patient does not look volume overloaded. Change IV fluids back to normal saline at 75 mL per hour and titrate as needed. Repeat basic metabolic profile this evening. Follow.  #2 leukocytosis Likely steroid-induced. No signs or symptoms of infection. Patient is afebrile. Follow.  #3 alcoholic liver cirrhosis Ammonia level and 19. Patient somewhat drowsy may be related to Ativan. Continue current regimen of lactulose. Continue prednisone taper secondary to recent acute alcoholic hepatitis.   #4: ETOH Abuse with acute withdrawal symptoms Patient noted to have acute withdrawal symptoms over the past 48 hours. Patient now somewhat drowsy. Decrease dose of IV Ativan on protocol. CIWA.  continue thiamine and folate, multivitamin. PT/OT.  #5 acute kidney injury Improved with hydration.   DVT prophylaxis:  SCDs Code Status: Full Family Communication: Updated patient. No family at bedside. Disposition Plan: Skilled nursing facility once hyponatremia is resolved and patient more alert and back to baseline.    Consultants:   None  Procedures:   None  Antimicrobials:   None   Subjective: Patient drowsy however it arousable and following some commands. Denies any chest pain or shortness of breath.  Objective: Vitals:   01/27/17 1100 01/27/17 1305 01/27/17 2313 01/28/17 0608  BP: 124/79 118/74 128/83 117/71  Pulse: 100 87 81 74  Resp: (!) 24 20 18 18   Temp:  98.2 F (36.8 C) 98 F (36.7 C) 98.7 F (37.1 C)  TempSrc:   Oral Oral  SpO2: 100% 100% 98% 100%  Weight:      Height:        Intake/Output Summary (Last 24 hours) at 01/28/17 1248 Last data filed at 01/28/17 [id number]  Gross per 24 hour  Intake           1477.5 ml  Output              125 ml  Net           1352.5 ml   Filed Weights   01/25/17 1842 01/25/17 2337  Weight: 88.5 kg (195 lb) 88.7 kg (195 lb 8.8 oz)    Examination:  General exam: Drowsy. Jaundiced. Respiratory system: Clear to auscultation. Respiratory effort normal. Cardiovascular system: S1 & S2 heard, RRR. No JVD, murmurs, rubs, gallops or clicks. No pedal edema. Gastrointestinal system: Abdomen is mildly distended, soft and nontender. No organomegaly or masses felt. Normal bowel sounds heard. Central nervous system: Alert and oriented. No focal neurological deficits. Extremities: Symmetric 5 x 5 power. Skin: No rashes, lesions or ulcers Psychiatry:  Judgement and insight appear normal. Mood & affect appropriate.     Data Reviewed: I have personally reviewed following labs and imaging studies  CBC:  Recent Labs Lab 01/25/17 2012 01/27/17 0113 01/28/17 0402  WBC 36.6* 29.5* 22.7*  NEUTROABS 33.9* 25.6* 20.2*  HGB 9.2* 8.1* 7.9*  HCT 25.2* 22.6* 23.4*  MCV 89.4 91.9 94.0  PLT 283 222 206   Basic Metabolic Panel:  Recent Labs Lab  01/26/17 1413 01/26/17 2007 01/27/17 0113 01/27/17 1605 01/28/17 0402  NA 119* 119* 119* 120* 120*  K 4.9 4.8 4.4 5.1 4.7  CL 94* 93* 94* 94* 93*  CO2 18* 19* 21* 21* 21*  GLUCOSE 149* 156* 135* 146* 119*  BUN 37* 35* 33* 32* 28*  CREATININE 1.03 1.24 1.16 1.06 0.99  CALCIUM 8.3* 8.4* 8.3* 8.3* 8.3*   GFR: Estimated Creatinine Clearance: 91.9 mL/min (by C-G formula based on SCr of 0.99 mg/dL). Liver Function Tests:  Recent Labs Lab 01/25/17 2012 01/26/17 0407  AST 95* 72*  ALT 112* 89*  ALKPHOS 249* 205*  BILITOT 7.0* 5.6*  PROT 7.3 5.9*  ALBUMIN 3.1* 2.7*   No results for input(s): LIPASE, AMYLASE in the last 168 hours.  Recent Labs Lab 01/27/17 0113 01/28/17 0402  AMMONIA 23 19   Coagulation Profile: No results for input(s): INR, PROTIME in the last 168 hours. Cardiac Enzymes: No results for input(s): CKTOTAL, CKMB, CKMBINDEX, TROPONINI in the last 168 hours. BNP (last 3 results) No results for input(s): PROBNP in the last 8760 hours. HbA1C: No results for input(s): HGBA1C in the last 72 hours. CBG:  Recent Labs Lab 01/27/17 0743 01/27/17 1254 01/27/17 1818  GLUCAP 106* 119* 159*   Lipid Profile: No results for input(s): CHOL, HDL, LDLCALC, TRIG, CHOLHDL, LDLDIRECT in the last 72 hours. Thyroid Function Tests: No results for input(s): TSH, T4TOTAL, FREET4, T3FREE, THYROIDAB in the last 72 hours. Anemia Panel: No results for input(s): VITAMINB12, FOLATE, FERRITIN, TIBC, IRON, RETICCTPCT in the last 72 hours. Sepsis Labs: No results for input(s): PROCALCITON, LATICACIDVEN in the last 168 hours.  No results found for this or any previous visit (from the past 240 hour(s)).       Radiology Studies: No results found.      Scheduled Meds: . famotidine  20 mg Oral BID  . folic acid  1 mg Oral Daily  . gabapentin  300 mg Oral QHS  . lactulose  30 g Oral BID  . LORazepam  0-2 mg Oral TID   Followed by  . LORazepam  0-2 mg Oral BID  .  multivitamin with minerals  1 tablet Oral Daily  . predniSONE  40 mg Oral Q breakfast  . thiamine  100 mg Oral Daily   Or  . thiamine  100 mg Intravenous Daily   Continuous Infusions: . sodium chloride 75 mL/hr at 01/28/17 0956     LOS: 3 days    Time spent: 35 mins    Jordane Hisle,Loyd, MD [facility] Pager [phone]  If 7PM-7AM, please contact night-coverage [email or web address] Password [facility] 01/28/2017, 12:48 PM

## 2017-01-29 LAB — COMPREHENSIVE METABOLIC PANEL
ALBUMIN: 2.9 g/dL — AB (ref 3.5–5.0)
ALT: 106 U/L — AB (ref 17–63)
ANION GAP: 7 (ref 5–15)
AST: 95 U/L — AB (ref 15–41)
Alkaline Phosphatase: 240 U/L — ABNORMAL HIGH (ref 38–126)
BILIRUBIN TOTAL: 4.6 mg/dL — AB (ref 0.3–1.2)
BUN: 26 mg/dL — ABNORMAL HIGH (ref 6–20)
CALCIUM: 8.7 mg/dL — AB (ref 8.9–10.3)
CO2: 20 mmol/L — ABNORMAL LOW (ref 22–32)
CREATININE: 1.05 mg/dL (ref 0.61–1.24)
Chloride: 98 mmol/L — ABNORMAL LOW (ref 101–111)
GFR calc Af Amer: >60 mL/min (ref >60)
Glucose, Bld: 130 mg/dL — ABNORMAL HIGH (ref 65–99)
Potassium: 4.7 mmol/L (ref 3.5–5.1)
Sodium: 125 mmol/L — ABNORMAL LOW (ref 135–145)
Total Protein: 6.3 g/dL — ABNORMAL LOW (ref 6.5–8.1)

## 2017-01-29 LAB — TSH: TSH: 2.225 u[IU]/mL (ref 0.350–4.500)

## 2017-01-29 LAB — PROTIME-INR
INR: 1.32
PROTHROMBIN TIME: 16.5 s — AB (ref 11.4–15.2)

## 2017-01-29 NOTE — Progress Notes (Signed)
PROGRESS NOTE    Richard InglesDaniel Bird  RUE:[id number] DOB: 04-30-1966 DOA: 01/25/2017 PCP: Juluis MireJennings, William, MD    Brief Narrative:  51 yo male, presents with the chief complain of abnormal labs. Recent hospitalization for liver failure and ascites, related to alcoholic liver disease, discharge on diuretics and lactulose. Follow up appointment as outpatient found leukocytosis and hyponatremic. On the initial physical examination, patient was hemodynamically stable. Nonfocal. Abdomen with minimal ascites. Serum sodium found to be very low at 111, admitted for electrolyte correction. Patient has developed alcohol withdrawal symptoms, placed on benzodiazepines per protocol. Slowly correcting serum Na. Patient very sensitive to benzodiazepines. Lorazepam discontinued.    Assessment & Plan:   Principal Problem:   Hyponatremia Active Problems:   Alcoholic cirrhosis of liver with ascites (HCC)   Normocytic anemia   Hepatitis, alcoholic, acute   AKI (acute kidney injury) (HCC)   Leukocytosis   1. Hyponatremia. Serum sodium at 125, will continue hydration with isotonic saline at 75 cc.hr and will follow renal panel in am. Will plan to discontinue IV fluids when serum Na at 130. Patient tolerating po well, neurologically non focal.     2. Steroid induced leukocytosis.  White count at 22.7, will continue taper prednisone as instructed, decreased dose to 30 mg daily on 05/22 then decreased by 10 every week.   3. Alcoholic liver cirrhosis. No asterixis, oversedation presumed to be related to benzodiazepines, will continue current dose of lactulose, continue neuro checks and physical therapy, ambulate and out of bed as tolerated to the chair. DC lorazepam. Will resume diuretics at discharge.   4. Etho abuse with acute withdrawal symptoms. Continue multivitamins including folate and thiamine. Patient not showing active withdrawal symptoms, noted high sensitivity to benzodiazepines, will hold lorazepam  and continue clos neurologic monitoring. Fall precautions and physical therapy evaluation.   5. AKI. Renal function with cr at 1.0, will continue to follow electrolytes and cr, avoid hypotension and nephrotoxic medications. Appropriate increased of serum Na. K at 4,7 and serum bicarbonate at 20 with non gap anion gap metabolic acidosis.   DVT prophylaxis:scd Code Status:full  Family Communication:I spoke with patient's daughter at the bedside and all questions were addressed.  Disposition Plan:home    Consultants:     Procedures:      Antimicrobials:      Subjective: Patient feeling better, not sleeping very well at night per patient's daughter. No nausea or vomiting, no dyspnea, chest pain or abdominal pain.   Objective: Vitals:   01/28/17 0608 01/28/17 1500 01/28/17 2203 01/29/17 0453  BP: 117/71 140/80 (!) 134/95 123/86  Pulse: 74 95 93 100  Resp: 18 18 18    Temp: 98.7 F (37.1 C) 98.7 F (37.1 C) 97.8 F (36.6 C) 98 F (36.7 C)  TempSrc: Oral Oral Oral Oral  SpO2: 100% 99% 100% 100%  Weight:      Height:        Intake/Output Summary (Last 24 hours) at 01/29/17 1208 Last data filed at 01/29/17 1015  Gross per 24 hour  Intake             1560 ml  Output              200 ml  Net             1360 ml   Filed Weights   01/25/17 1842 01/25/17 2337  Weight: 88.5 kg (195 lb) 88.7 kg (195 lb 8.8 oz)    Examination:  General exam: deconditioned E ENT:  Mild icterus, oral mucosa moist, mild pallor.  Respiratory system: Clear to auscultation. Respiratory effort normal. No wheezing, rales or rhonchi.  Cardiovascular system: S1 & S2 heard, RRR. No JVD, murmurs, rubs, gallops or clicks. No pedal edema. Gastrointestinal system: Abdomen is nondistended, soft and nontender. No organomegaly or masses felt. Normal bowel sounds heard. Central nervous system: mild somnolence, no agitation or confusion, easy to arouse. No focal neurological deficits. Extremities:  Symmetric 5 x 5 power. Skin: No rashes, lesions or ulcers   Data Reviewed: I have personally reviewed following labs and imaging studies  CBC:  Recent Labs Lab 01/25/17 2012 01/27/17 0113 01/28/17 0402  WBC 36.6* 29.5* 22.7*  NEUTROABS 33.9* 25.6* 20.2*  HGB 9.2* 8.1* 7.9*  HCT 25.2* 22.6* 23.4*  MCV 89.4 91.9 94.0  PLT 283 222 206   Basic Metabolic Panel:  Recent Labs Lab 01/27/17 0113 01/27/17 1605 01/28/17 0402 01/28/17 2131 01/29/17 0445  NA 119* 120* 120* 123* 125*  K 4.4 5.1 4.7 4.9 4.7  CL 94* 94* 93* 96* 98*  CO2 21* 21* 21* 21* 20*  GLUCOSE 135* 146* 119* 128* 130*  BUN 33* 32* 28* 24* 26*  CREATININE 1.16 1.06 0.99 0.96 1.05  CALCIUM 8.3* 8.3* 8.3* 8.5* 8.7*   GFR: Estimated Creatinine Clearance: 86.7 mL/min (by C-G formula based on SCr of 1.05 mg/dL). Liver Function Tests:  Recent Labs Lab 01/25/17 2012 01/26/17 0407 01/29/17 0445  AST 95* 72* 95*  ALT 112* 89* 106*  ALKPHOS 249* 205* 240*  BILITOT 7.0* 5.6* 4.6*  PROT 7.3 5.9* 6.3*  ALBUMIN 3.1* 2.7* 2.9*   No results for input(s): LIPASE, AMYLASE in the last 168 hours.  Recent Labs Lab 01/27/17 0113 01/28/17 0402  AMMONIA 23 19   Coagulation Profile:  Recent Labs Lab 01/29/17 0445  INR 1.32   Cardiac Enzymes: No results for input(s): CKTOTAL, CKMB, CKMBINDEX, TROPONINI in the last 168 hours. BNP (last 3 results) No results for input(s): PROBNP in the last 8760 hours. HbA1C: No results for input(s): HGBA1C in the last 72 hours. CBG:  Recent Labs Lab 01/27/17 0743 01/27/17 1254 01/27/17 1818  GLUCAP 106* 119* 159*   Lipid Profile: No results for input(s): CHOL, HDL, LDLCALC, TRIG, CHOLHDL, LDLDIRECT in the last 72 hours. Thyroid Function Tests:  Recent Labs  01/29/17 0445  TSH 2.225   Anemia Panel: No results for input(s): VITAMINB12, FOLATE, FERRITIN, TIBC, IRON, RETICCTPCT in the last 72 hours. Sepsis Labs: No results for input(s): PROCALCITON, LATICACIDVEN  in the last 168 hours.  No results found for this or any previous visit (from the past 240 hour(s)).       Radiology Studies: No results found.      Scheduled Meds: . famotidine  20 mg Oral BID  . folic acid  1 mg Oral Daily  . gabapentin  300 mg Oral QHS  . lactulose  30 g Oral BID  . multivitamin with minerals  1 tablet Oral Daily  . predniSONE  40 mg Oral Q breakfast  . thiamine  100 mg Oral Daily   Or  . thiamine  100 mg Intravenous Daily   Continuous Infusions: . sodium chloride 1,000 mL (01/29/17 1159)     LOS: 4 days       Mauricio Annett Gula, MD [facility] Pager [phone]  If 7PM-7AM, please contact night-coverage [email or web address] Password [facility] 01/29/2017, 12:08 PM

## 2017-01-29 NOTE — Progress Notes (Signed)
PT Cancellation Note  Patient Details Name: Richard Bird MRN: [id number] DOB: [birth year]   Cancelled Treatment:    Reason Eval/Treat Not Completed: Other (comment) (patient just ambulated around the unit w/ RW. now wants to eat lunch. will try tro attempt tomorrow.)   Rada HayHill, Michelle Wnek Elizabeth 01/29/2017, 2:59 PM

## 2017-01-30 LAB — BASIC METABOLIC PANEL
Anion gap: 6 (ref 5–15)
BUN: 21 mg/dL — AB (ref 6–20)
CALCIUM: 8.4 mg/dL — AB (ref 8.9–10.3)
CO2: 21 mmol/L — ABNORMAL LOW (ref 22–32)
Chloride: 99 mmol/L — ABNORMAL LOW (ref 101–111)
Creatinine, Ser: 0.85 mg/dL (ref 0.61–1.24)
GFR calc Af Amer: >60 mL/min (ref >60)
GLUCOSE: 114 mg/dL — AB (ref 65–99)
Potassium: 4.3 mmol/L (ref 3.5–5.1)
Sodium: 126 mmol/L — ABNORMAL LOW (ref 135–145)

## 2017-01-30 MED ORDER — DIPHENHYDRAMINE HCL 25 MG PO CAPS
25.0000 mg | ORAL_CAPSULE | Freq: Every evening | ORAL | Status: DC | PRN
  Administered 2017-01-30: 25 mg via ORAL
  Filled 2017-01-30: qty 1

## 2017-01-30 NOTE — Progress Notes (Signed)
PROGRESS NOTE    Richard Bird  XBJ:[id number] DOB: 1966-02-08 DOA: 01/25/2017 PCP: Juluis MireJennings, William, MD    Brief Narrative:  51 yo male, presents with the chief complain of abnormal labs. Recent hospitalization for liver failure and ascites, related to alcoholic liver disease, discharge on diuretics and lactulose. Follow up appointment as outpatient found leukocytosis and hyponatremic. On the initial physical examination, patient was hemodynamically stable. Nonfocal. Abdomen with minimal ascites. Serum sodium found to be very low at 111, admitted for electrolyte correction. Patient has developed alcohol withdrawal symptoms, placed on benzodiazepines per protocol. Slowly correcting serum Na. Patient very sensitive to benzodiazepines. Lorazepam discontinued. Patient placed on fluid restriction.    Assessment & Plan:   Principal Problem:   Hyponatremia Active Problems:   Alcoholic cirrhosis of liver with ascites (HCC)   Normocytic anemia   Hepatitis, alcoholic, acute   AKI (acute kidney injury) (HCC)   Leukocytosis   1. Hyponatremia. Serum sodium at 126, will discontinue IV fluids, will place patient on fluid restriction and will discontinue po salt restriction, will follow on renal panel in am. Patient awake and alert, with no neurological deficit.    2. Steroid induced leukocytosis. Plan to decrease dose to 30 mg daily on 05/22 then decreased by 10 every week.   3. Alcoholic liver cirrhosis. No clinical signs of hepatic encephalopathy, ammonia has been not elevated on this admission, will continue bid lactulose.   4. Etho abuse with acute withdrawal symptoms. On multivitamins including folate and thiamine. Patient off benzodiazepines, more awaeke and alert.   5. AKI. Resolved, will continue to follow electrolytes in am. Will liberate diet.    DVT prophylaxis:scd Code Status:full  Family Communication:I spoke with patient's daughter at the bedside and all questions  were addressed.  Disposition Plan:home    Consultants:    Procedures:     Antimicrobials:      Subjective: Patient sleeping during the day and awake at night, no chest pain or dyspnea, no nausea or vomiting and tolerating po well.   Objective: Vitals:   01/29/17 1400 01/29/17 1945 01/29/17 1949 01/30/17 0518  BP: 123/67 (!) 151/101 129/83 134/89  Pulse: 100 95  88  Resp: 18 16  20   Temp: 97.8 F (36.6 C) 98 F (36.7 C)  97.8 F (36.6 C)  TempSrc: Oral Oral  Oral  SpO2: 100% 100%  100%  Weight:      Height:        Intake/Output Summary (Last 24 hours) at 01/30/17 0927 Last data filed at 01/30/17 0700  Gross per 24 hour  Intake             3460 ml  Output             2200 ml  Net             1260 ml   Filed Weights   01/25/17 1842 01/25/17 2337  Weight: 88.5 kg (195 lb) 88.7 kg (195 lb 8.8 oz)    Examination:  General exam: deconditioned E ENT: mild pallor, positive icterus, oral mucosa moist.  Respiratory system: Clear to auscultation. Respiratory effort normal. No wheezing, rales or rhonchi.  Cardiovascular system: S1 & S2 heard, RRR. No JVD, murmurs, rubs, gallops or clicks. No pedal edema. Gastrointestinal system: Abdomen is nondistended, soft and nontender. No organomegaly or masses felt. Normal bowel sounds heard. Central nervous system: Alert and oriented. No focal neurological deficits. Mild resting tremors.  Extremities: Symmetric 5 x 5 power. Skin: No rashes,  lesions or ulcers    Data Reviewed: I have personally reviewed following labs and imaging studies  CBC:  Recent Labs Lab 01/25/17 2012 01/27/17 0113 01/28/17 0402  WBC 36.6* 29.5* 22.7*  NEUTROABS 33.9* 25.6* 20.2*  HGB 9.2* 8.1* 7.9*  HCT 25.2* 22.6* 23.4*  MCV 89.4 91.9 94.0  PLT 283 222 206   Basic Metabolic Panel:  Recent Labs Lab 01/27/17 1605 01/28/17 0402 01/28/17 2131 01/29/17 0445 01/30/17 0408  NA 120* 120* 123* 125* 126*  K 5.1 4.7 4.9 4.7 4.3    CL 94* 93* 96* 98* 99*  CO2 21* 21* 21* 20* 21*  GLUCOSE 146* 119* 128* 130* 114*  BUN 32* 28* 24* 26* 21*  CREATININE 1.06 0.99 0.96 1.05 0.85  CALCIUM 8.3* 8.3* 8.5* 8.7* 8.4*   GFR: Estimated Creatinine Clearance: 107 mL/min (by C-G formula based on SCr of 0.85 mg/dL). Liver Function Tests:  Recent Labs Lab 01/25/17 2012 01/26/17 0407 01/29/17 0445  AST 95* 72* 95*  ALT 112* 89* 106*  ALKPHOS 249* 205* 240*  BILITOT 7.0* 5.6* 4.6*  PROT 7.3 5.9* 6.3*  ALBUMIN 3.1* 2.7* 2.9*   No results for input(s): LIPASE, AMYLASE in the last 168 hours.  Recent Labs Lab 01/27/17 0113 01/28/17 0402  AMMONIA 23 19   Coagulation Profile:  Recent Labs Lab 01/29/17 0445  INR 1.32   Cardiac Enzymes: No results for input(s): CKTOTAL, CKMB, CKMBINDEX, TROPONINI in the last 168 hours. BNP (last 3 results) No results for input(s): PROBNP in the last 8760 hours. HbA1C: No results for input(s): HGBA1C in the last 72 hours. CBG:  Recent Labs Lab 01/27/17 0743 01/27/17 1254 01/27/17 1818  GLUCAP 106* 119* 159*   Lipid Profile: No results for input(s): CHOL, HDL, LDLCALC, TRIG, CHOLHDL, LDLDIRECT in the last 72 hours. Thyroid Function Tests:  Recent Labs  01/29/17 0445  TSH 2.225   Anemia Panel: No results for input(s): VITAMINB12, FOLATE, FERRITIN, TIBC, IRON, RETICCTPCT in the last 72 hours. Sepsis Labs: No results for input(s): PROCALCITON, LATICACIDVEN in the last 168 hours.  No results found for this or any previous visit (from the past 240 hour(s)).       Radiology Studies: No results found.      Scheduled Meds: . famotidine  20 mg Oral BID  . folic acid  1 mg Oral Daily  . gabapentin  300 mg Oral QHS  . lactulose  30 g Oral BID  . multivitamin with minerals  1 tablet Oral Daily  . predniSONE  40 mg Oral Q breakfast  . thiamine  100 mg Oral Daily   Or  . thiamine  100 mg Intravenous Daily   Continuous Infusions:   LOS: 5 days       Baila Rouse Annett Gula, MD [facility] Pager [phone]  If 7PM-7AM, please contact night-coverage [email or web address] Password [facility] 01/30/2017, 9:27 AM

## 2017-01-31 DIAGNOSIS — R262 Difficulty in walking, not elsewhere classified: Secondary | ICD-10-CM | POA: Diagnosis present

## 2017-01-31 LAB — BASIC METABOLIC PANEL
ANION GAP: 8 (ref 5–15)
BUN: 25 mg/dL — ABNORMAL HIGH (ref 6–20)
CHLORIDE: 98 mmol/L — AB (ref 101–111)
CO2: 21 mmol/L — AB (ref 22–32)
CREATININE: 0.89 mg/dL (ref 0.61–1.24)
Calcium: 8.6 mg/dL — ABNORMAL LOW (ref 8.9–10.3)
GFR calc non Af Amer: >60 mL/min (ref >60)
Glucose, Bld: 113 mg/dL — ABNORMAL HIGH (ref 65–99)
POTASSIUM: 4.5 mmol/L (ref 3.5–5.1)
Sodium: 127 mmol/L — ABNORMAL LOW (ref 135–145)

## 2017-01-31 MED ORDER — FUROSEMIDE 40 MG PO TABS: 20.0000 mg | ORAL_TABLET | Freq: Every day | ORAL | 0 refills | Status: DC

## 2017-01-31 NOTE — Progress Notes (Signed)
Physical Therapy Treatment Patient Details Name: Richard InglesDaniel Bird MRN: [id number] DOB: 13-Mar-1966 Today's Date: 01/31/2017    History of Present Illness 51 yo male admitted with hyponatremia. Hx of ETOH dep, ETOH cirrhosis.     PT Comments    Pt present with increased cognition and memory.  Assisted OOB to amb a great distance in hallway without any AD as prior.  Noted improved gait stability from prior session however still present with R stagger, R ankle instability during stance and L knee weakness.  Performed BERG balance test which pt scored 38/56 indicating FALL RISK and need for AD.  Rec single point cane esp out of house.  Consulted with LPT and also rec HH PT.  Pt plans to D/C back to his 3rd floor apartment today.    Follow Up Recommendations  Home health PT ([facility]     Equipment Recommendations  Cane    Recommendations for Other Services       Precautions / Restrictions Precautions Precautions: Fall Precaution Comments: "bad L knee" wear a brace but left it at home Restrictions Weight Bearing Restrictions: No    Mobility  Bed Mobility Overal bed mobility: Modified Independent                Transfers Overall transfer level: Modified independent               General transfer comment: uses hands to steady self and demonstartes increased cognition and safety)environment) awareness  Ambulation/Gait Ambulation/Gait assistance: Min assist Ambulation Distance (Feet): 350 Feet Assistive device: None Gait Pattern/deviations: Step-through pattern;Drifts right/left;Staggering right Gait velocity: WFL   General Gait Details: amb without any AD as prior required Min Assist/Min Guard Assist with noted R ankle instability during stance and L knne weakness with decreased stance time.  Pt would benefit from a staright cane.     Stairs            Wheelchair Mobility    Modified Rankin (Stroke Patients Only)       Balance                                 Standardized Balance Assessment Standardized Balance Assessment : Berg Balance Test Berg Balance Test Sit to Stand: Able to stand without using hands and stabilize independently Standing Unsupported: Able to stand safely 2 minutes Sitting with Back Unsupported but Feet Supported on Floor or Stool: Able to sit safely and securely 2 minutes Stand to Sit: Sits safely with minimal use of hands Transfers: Able to transfer safely, minor use of hands Standing Unsupported with Eyes Closed: Able to stand 10 seconds with supervision Standing Ubsupported with Feet Together: Able to place feet together independently but unable to hold for 30 seconds From Standing, Reach Forward with Outstretched Arm: Can reach forward >12 cm safely (5") From Standing Position, Pick up Object from Floor: Able to pick up shoe, needs supervision From Standing Position, Turn to Look Behind Over each Shoulder: Looks behind one side only/other side shows less weight shift Turn 360 Degrees: Able to turn 360 degrees safely but slowly Standing Unsupported, Alternately Place Feet on Step/Stool: Able to complete >2 steps/needs minimal assist Standing Unsupported, One Foot in Front: Needs help to step but can hold 15 seconds Standing on One Leg: Unable to try or needs assist to prevent fall Total Score: 38        Cognition  Arousal/Alertness: Awake/alert Behavior During Therapy: WFL for tasks assessed/performed Overall Cognitive Status: Within Functional Limits for tasks assessed                                 General Comments: appears more alert and oriented to current situation and can recall past events      Exercises  performed and educated on partial standing squats, heel raises and one leg stance while holding to back of chair.      General Comments General comments (skin integrity, edema, etc.): pt scored 38/56 (15 point increase) however still  indicates FALL RISK and need for AD.  Rec single point cane esp out of house.      Pertinent Vitals/Pain Pain Assessment: No/denies pain    Home Living                      Prior Function            PT Goals (current goals can now be found in the care plan section) Progress towards PT goals: Progressing toward goals    Frequency    Min 3X/week      PT Plan Current plan remains appropriate    Co-evaluation              AM-PAC PT "6 Clicks" Daily Activity  Outcome Measure  Difficulty turning over in bed (including adjusting bedclothes, sheets and blankets)?: A Little Difficulty moving from lying on back to sitting on the side of the bed? : A Little Difficulty sitting down on and standing up from a chair with arms (e.g., wheelchair, bedside commode, etc,.)?: A Little Help needed moving to and from a bed to chair (including a wheelchair)?: A Little Help needed walking in hospital room?: A Little Help needed climbing 3-5 steps with a railing? : A Little 6 Click Score: 18    End of Session Equipment Utilized During Treatment: Gait belt Activity Tolerance: Patient tolerated treatment well Patient left: in bed;with call bell/phone within reach;with family/visitor present Nurse Communication: Mobility status PT Visit Diagnosis: Muscle weakness (generalized) (M62.81);Difficulty in walking, not elsewhere classified (R26.2)     Time: 1000-1030 PT Time Calculation (min) (ACUTE ONLY): 30 min  Charges:  $Gait Training: 8-22 mins $Therapeutic Activity: 8-22 mins                    G Codes:       Felecia Shelling  PTA [facility]  Acute  Rehab Pager      [phone]

## 2017-01-31 NOTE — Discharge Summary (Signed)
Physician Discharge Summary  Richard Bird ZOX:[id number] DOB: [birth year] DOA: 01/25/2017  PCP: Juluis Mire, MD  Admit date: 01/25/2017 Discharge date: 01/31/2017  Admitted From: Home  Disposition:  Home   Recommendations for Outpatient Follow-up:  1. Follow up with PCP in 1- weeks 2. Please obtain BMP in 48 hours 3. Dose of furosemide has been decreased to 20 mg daily 4. Holding aldactone due to hyperkalemia 5. Please keep fluid restriction to 1200 ml per day.   Home Health: Patient refused  Equipment/Devices: One point cane.   Discharge Condition: Stable  CODE STATUS: Full  Diet recommendation: regular   Brief/Interim Summary: 51 yo male, presents with the chief complain of abnormal labs. Recent hospitalization for liver failure and ascites, related to alcoholic liver disease, discharge on steroids, diuretics and lactulose. Follow up appointment as outpatient found leukocytosis and severe hyponatremia. On the initial physical examination, patient was hemodynamically stable. Blood pressure 132/59, heart rate 82, temperature 97.5, respiratory rate 18, oxygen saturation 100%. Neurologically nonfocal. Abdomen with minimal ascites. Sodium 111, potassium 5.3, chloride 80, bicarbonate 22, glucose 107, BUN 41, creatinine 1.34, alkaline phosphatase 249, albumin 3.1, AST 95, ALT 112, ammonia 7.0, white count of 36.6, hemoglobin 9.2, hematocrit 25.2, platelets 283, serum osmolarity 250, urine osmolarity 327, urinary sodium 40, urinalysis negative for infection.   Patient was admitted with the working diagnosis of severe hypoNatremia and leukocytosis  1. Hyponatremia, hypoosmolar, hypovolemic. Patient was admitted to the medical unit, he was placed on different type of IV fluids including isotonic saline, half normal saline and D5 water, aiming for a slow progressive correction of serum sodium, targeting about 8 mEq increase of serum sodium per 24 hours. Patient responded well, his serum sodium  is up to 127, currently on a fluid restriction, 1200 ml per day. Initially his diuretic therapy was held. Patient will resume furosemide with 20 mg daily, will recommend to check a basic metabolic panel in 48 hours. Patient was seen by physical therapy with recommendations for home health, home PT and cane to assist ambulation.  2. Steroid-induced leukocytosis. No signs of infection, patient will continue slow taper of prednisone, plan to decrease dose to 30 mg daily on May 22 and then decrease by 10 mg every week.  3. Alcoholic liver cirrhosis. Patient was continued on lactulose, no significant elevation of ammonia level. Will resume her diuretic therapy with furosemide, 20 mg daily. For now will hold spironolactone, due to a increased risk of hyperkalemia.   4. History of alcohol abuse with acute withdrawal symptoms. Patient was placed on benzodiazepine per withdrawal protocol, he was found to have high sensitivity to benzodiazepines, patient noted oversedated. Sedatives were discontinued with improvement of patient's mentation, no further symptoms of withdrawal. Patient will continue multivitamins including folate and thiamine.  5. Acute kidney injury. Admission creatinine 1.34, patient received IV fluids with improvement of kidney function, discharge creatinine 0.89. His potassium remained elevated, discharge potassium 4.5. Will recommend to hold spironolactone and potassium supplements due to risk of hyperkalemia, patient will need a basic metabolic panel 48 hours after discharge.     Discharge Diagnoses:  Principal Problem:   Hyponatremia Active Problems:   Alcoholic cirrhosis of liver with ascites (HCC)   Normocytic anemia   Hepatitis, alcoholic, acute   AKI (acute kidney injury) (HCC)   Leukocytosis    Discharge Instructions   Allergies as of 01/31/2017      Reactions   Penicillins Other (See Comments)   Reaction:  Unknown  Has patient had  a PCN reaction causing immediate rash,  facial/tongue/throat swelling, SOB or lightheadedness with hypotension: Unsure Has patient had a PCN reaction causing severe rash involving mucus membranes or skin necrosis: Unsure Has patient had a PCN reaction that required hospitalization Unsure Has patient had a PCN reaction occurring within the last 10 years: No If all of the above answers are "NO", then may proceed with Cephalosporin use.      Medication List    STOP taking these medications   potassium chloride SA 20 MEQ tablet Commonly known as:  K-DUR,KLOR-CON   spironolactone 100 MG tablet Commonly known as:  ALDACTONE     TAKE these medications   bisacodyl 5 MG EC tablet Commonly known as:  DULCOLAX Take 1 tablet (5 mg total) by mouth daily as needed for moderate constipation.   famotidine 20 MG tablet Commonly known as:  PEPCID Take 1 tablet (20 mg total) by mouth 2 (two) times daily.   folic acid 1 MG tablet Commonly known as:  FOLVITE Take 1 tablet (1 mg total) by mouth daily.   furosemide 40 MG tablet Commonly known as:  LASIX Take 0.5 tablets (20 mg total) by mouth daily. What changed:  how much to take   gabapentin 300 MG capsule Commonly known as:  NEURONTIN Take 300 mg by mouth at bedtime.   hydrOXYzine 25 MG tablet Commonly known as:  ATARAX/VISTARIL Take 25 mg by mouth 3 (three) times daily as needed for itching.   lactulose 10 GM/15ML solution Commonly known as:  CHRONULAC Take 45 mLs (30 g total) by mouth 2 (two) times daily.   multivitamin with minerals Tabs tablet Take 1 tablet by mouth daily.   predniSONE 5 MG tablet Commonly known as:  DELTASONE Take 10-40 mg by mouth daily with breakfast. Pt is to take as a taper:  8 tablets daily for five weeks,  6 tablets daily for one week, 4 tablets daily for one week, 2 tablets daily for one week, then STOP.   thiamine 100 MG tablet Take 1 tablet (100 mg total) by mouth daily.            Durable Medical Equipment        Start      Ordered   01/31/17 0000  DME Other see comment    Comments:  Please provide one point cane, for ambulatory dysfunction.   01/31/17 1132      Allergies  Allergen Reactions  . Penicillins Other (See Comments)    Reaction:  Unknown  Has patient had a PCN reaction causing immediate rash, facial/tongue/throat swelling, SOB or lightheadedness with hypotension: Unsure Has patient had a PCN reaction causing severe rash involving mucus membranes or skin necrosis: Unsure Has patient had a PCN reaction that required hospitalization Unsure Has patient had a PCN reaction occurring within the last 10 years: No If all of the above answers are "NO", then may proceed with Cephalosporin use.     Consultations:   Procedures/Studies:  No results found.    Subjective: Patient has been feeling better, no nausea or vomiting, able to ambulate with physical therapy.   Discharge Exam: Vitals:   01/30/17 2035 01/31/17 0411  BP: 138/84 127/70  Pulse: 97 89  Resp: 20 18  Temp: 98.2 F (36.8 C) 97.7 F (36.5 C)   Vitals:   01/30/17 0518 01/30/17 1331 01/30/17 2035 01/31/17 0411  BP: 134/89 135/85 138/84 127/70  Pulse: 88 92 97 89  Resp: 20 20 20 18   Temp:  97.8 F (36.6 C) 98.7 F (37.1 C) 98.2 F (36.8 C) 97.7 F (36.5 C)  TempSrc: Oral Oral Oral Axillary  SpO2: 100% 100% 97% 100%  Weight:      Height:        General: Pt is alert, awake, not in acute distress Neck no jvd Cardiovascular: RRR, S1/S2 +, no rubs, no gallops Respiratory: CTA bilaterally, no wheezing, no rhonchi Abdominal: Soft, NT, ND, bowel sounds + Extremities: no edema, no cyanosis Neurology. No tremors, non focal, no confusion.     The results of significant diagnostics from this hospitalization (including imaging, microbiology, ancillary and laboratory) are listed below for reference.     Microbiology: No results found for this or any previous visit (from the past 240 hour(s)).   Labs: BNP (last 3  results)  Recent Labs  12/29/16 1940  BNP 108.9*   Basic Metabolic Panel:  Recent Labs Lab 01/28/17 0402 01/28/17 2131 01/29/17 0445 01/30/17 0408 01/31/17 0354  NA 120* 123* 125* 126* 127*  K 4.7 4.9 4.7 4.3 4.5  CL 93* 96* 98* 99* 98*  CO2 21* 21* 20* 21* 21*  GLUCOSE 119* 128* 130* 114* 113*  BUN 28* 24* 26* 21* 25*  CREATININE 0.99 0.96 1.05 0.85 0.89  CALCIUM 8.3* 8.5* 8.7* 8.4* 8.6*   Liver Function Tests:  Recent Labs Lab 01/25/17 2012 01/26/17 0407 01/29/17 0445  AST 95* 72* 95*  ALT 112* 89* 106*  ALKPHOS 249* 205* 240*  BILITOT 7.0* 5.6* 4.6*  PROT 7.3 5.9* 6.3*  ALBUMIN 3.1* 2.7* 2.9*   No results for input(s): LIPASE, AMYLASE in the last 168 hours.  Recent Labs Lab 01/27/17 0113 01/28/17 0402  AMMONIA 23 19   CBC:  Recent Labs Lab 01/25/17 2012 01/27/17 0113 01/28/17 0402  WBC 36.6* 29.5* 22.7*  NEUTROABS 33.9* 25.6* 20.2*  HGB 9.2* 8.1* 7.9*  HCT 25.2* 22.6* 23.4*  MCV 89.4 91.9 94.0  PLT 283 222 206   Cardiac Enzymes: No results for input(s): CKTOTAL, CKMB, CKMBINDEX, TROPONINI in the last 168 hours. BNP: Invalid input(s): POCBNP CBG:  Recent Labs Lab 01/27/17 0743 01/27/17 1254 01/27/17 1818  GLUCAP 106* 119* 159*   D-Dimer No results for input(s): DDIMER in the last 72 hours. Hgb A1c No results for input(s): HGBA1C in the last 72 hours. Lipid Profile No results for input(s): CHOL, HDL, LDLCALC, TRIG, CHOLHDL, LDLDIRECT in the last 72 hours. Thyroid function studies  Recent Labs  01/29/17 0445  TSH 2.225   Anemia work up No results for input(s): VITAMINB12, FOLATE, FERRITIN, TIBC, IRON, RETICCTPCT in the last 72 hours. Urinalysis    Component Value Date/Time   COLORURINE YELLOW 01/26/2017 0600   APPEARANCEUR CLEAR 01/26/2017 0600   LABSPEC 1.008 01/26/2017 0600   PHURINE 6.0 01/26/2017 0600   GLUCOSEU NEGATIVE 01/26/2017 0600   HGBUR NEGATIVE 01/26/2017 0600   BILIRUBINUR NEGATIVE 01/26/2017 0600    KETONESUR NEGATIVE 01/26/2017 0600   PROTEINUR NEGATIVE 01/26/2017 0600   NITRITE NEGATIVE 01/26/2017 0600   LEUKOCYTESUR NEGATIVE 01/26/2017 0600   Sepsis Labs Invalid input(s): PROCALCITONIN,  WBC,  LACTICIDVEN Microbiology No results found for this or any previous visit (from the past 240 hour(s)).   Time coordinating discharge: 45 minutes  SIGNED:   Coralie KeensMauricio Staton Arrien, MD  [facility] 01/31/2017, 11:06 AM Pager   If 7PM-7AM, please contact night-coverage [email or web address] Password [email or web address]

## 2017-01-31 NOTE — Progress Notes (Signed)
Per CSW conversation with pt, he declines home health services. No other CM needs communicated. Sandford Crazeora Ivanka Kirshner RN,BSN,NCM [phone]

## 2017-01-31 NOTE — Progress Notes (Signed)
CSW following for disposition/ DC planning.   Met with pt for update on DC expectations. Pt alert and oriented. Pt declines SNF and reports plan to go home. Pt reports following 4 days at SNF last month he has been independent at home and has been doing PT exercises given to him at previous SNF dc.  Pt states he will decline HH PT as well and denies any needs.  Pt agrees to voice any further social work needs if they arise during course of stay.  CSW signing off.  Sharren Bridge, MSW, LCSW Clinical Social Work 01/31/2017 [phone]

## 2017-01-31 NOTE — Progress Notes (Signed)
Pt discharged home in stable condition. Discharge instructions given. Script sent to pharmacy of choice. Pt verbalized understanding. No immediate questions or concerns at this time.  

## 2017-07-25 ENCOUNTER — Other Ambulatory Visit: Payer: Self-pay | Admitting: Ophthalmology

## 2017-07-27 ENCOUNTER — Encounter (HOSPITAL_COMMUNITY): Payer: Self-pay | Admitting: *Deleted

## 2017-07-27 NOTE — Progress Notes (Signed)
Spoke with pt for pre-op call. Pt denies cardiac history, chest pain or sob. Pt states he is not diabetic. Pt has hx of Cirrhosis since May. States he's no longer drinking alcohol since December 28, 2016.

## 2017-07-27 NOTE — Progress Notes (Signed)
Anesthesia Chart Review:  Pt is a same day work up.   Pt is a 51 year old male scheduled for R vitrectomy, scleral buckle, laser on 11/82018 with Stephannie LiJason Sanders, MD  - PCP is Joyce GrossHilary Buckler, PA (notes in care everywhere)  - GI is Tri Elie ConferHuu Le, MD (notes in care everywhere)  - Nephrologist is Lisette AbuAdetoye Lufadeju, MD (notes in care everywhere)   PMH includes:  Cirrhosis due to alcohol, anemia. Never smoker. Reportedly stopped alcohol in May 2018. S/p EGD with banding of esophageal varices 02/2017 (care everywhere)  - Hospitalized 6/21-27/18 for anemia due to GI bleed. S/p esophageal varices banding.   Medications include: folic acid, thiamine  Labs will be obtained day of surgery  EKG 12/29/16: Sinus tachycardia (101 bpm). Probable left atrial enlargement. Nonspecific T abnormalities  Echo 01/02/17:  - Left ventricle: The cavity size was normal. Wall thickness was increased in a pattern of moderate LVH. Systolic function was normal. The estimated ejection fraction was in the range of 60% to 65%. Wall motion was normal; there were no regional wall motion abnormalities. Left ventricular diastolic function parameters were normal. - Mitral valve: Mildly calcified annulus. Mildly thickened leaflets. There was moderate regurgitation. - Left atrium: The atrium was moderately to severely dilated. - Right atrium: The atrium was mildly dilated.  If labs acceptable day of surgery, I anticipate pt can proceed with surgery as scheduled.   Rica Mastngela Cypress Hinkson, FNP-BC [facility] Phone: [phone] 07/27/2017 12:55 PM'

## 2017-07-28 ENCOUNTER — Ambulatory Visit (HOSPITAL_COMMUNITY): Payer: Medicaid Other | Admitting: Emergency Medicine

## 2017-07-28 ENCOUNTER — Encounter (HOSPITAL_COMMUNITY): Payer: Self-pay | Admitting: *Deleted

## 2017-07-28 ENCOUNTER — Encounter (HOSPITAL_COMMUNITY)
Admission: RE | Disposition: A | Discharge status: Home / Self Care | Payer: Self-pay | Source: Ambulatory Visit | Attending: Ophthalmology

## 2017-07-28 ENCOUNTER — Ambulatory Visit (HOSPITAL_COMMUNITY)
Admission: RE | Admit: 2017-07-28 | Discharge: 2017-07-28 | Disposition: A | Discharge status: Home / Self Care | Payer: Medicaid Other | Source: Ambulatory Visit | Attending: Ophthalmology | Admitting: Ophthalmology

## 2017-07-28 DIAGNOSIS — Z79899 Other long term (current) drug therapy: Secondary | ICD-10-CM | POA: Insufficient documentation

## 2017-07-28 DIAGNOSIS — D649 Anemia, unspecified: Secondary | ICD-10-CM | POA: Insufficient documentation

## 2017-07-28 DIAGNOSIS — K7460 Unspecified cirrhosis of liver: Secondary | ICD-10-CM | POA: Insufficient documentation

## 2017-07-28 DIAGNOSIS — Z880 Allergy status to penicillin: Secondary | ICD-10-CM | POA: Insufficient documentation

## 2017-07-28 DIAGNOSIS — D509 Iron deficiency anemia, unspecified: Secondary | ICD-10-CM | POA: Insufficient documentation

## 2017-07-28 DIAGNOSIS — I340 Nonrheumatic mitral (valve) insufficiency: Secondary | ICD-10-CM | POA: Insufficient documentation

## 2017-07-28 DIAGNOSIS — H3321 Serous retinal detachment, right eye: Secondary | ICD-10-CM | POA: Diagnosis present

## 2017-07-28 DIAGNOSIS — E871 Hypo-osmolality and hyponatremia: Secondary | ICD-10-CM | POA: Diagnosis not present

## 2017-07-28 DIAGNOSIS — H33021 Retinal detachment with multiple breaks, right eye: Secondary | ICD-10-CM | POA: Diagnosis not present

## 2017-07-28 HISTORY — DX: Hypo-osmolality and hyponatremia: E87.1

## 2017-07-28 HISTORY — PX: VITRECTOMY 25 GAUGE WITH SCLERAL BUCKLE: SHX6183

## 2017-07-28 HISTORY — DX: Anemia, unspecified: D64.9

## 2017-07-28 LAB — COMPREHENSIVE METABOLIC PANEL
ALT: 27 U/L (ref 17–63)
ANION GAP: 6 (ref 5–15)
AST: 36 U/L (ref 15–41)
Albumin: 3.6 g/dL (ref 3.5–5.0)
Alkaline Phosphatase: 57 U/L (ref 38–126)
BILIRUBIN TOTAL: 1.6 mg/dL — AB (ref 0.3–1.2)
BUN: 10 mg/dL (ref 6–20)
CHLORIDE: 110 mmol/L (ref 101–111)
CO2: 21 mmol/L — ABNORMAL LOW (ref 22–32)
Calcium: 9.4 mg/dL (ref 8.9–10.3)
Creatinine, Ser: 0.89 mg/dL (ref 0.61–1.24)
Glucose, Bld: 85 mg/dL (ref 65–99)
POTASSIUM: 4.1 mmol/L (ref 3.5–5.1)
Sodium: 137 mmol/L (ref 135–145)
TOTAL PROTEIN: 6.5 g/dL (ref 6.5–8.1)

## 2017-07-28 LAB — CBC
HEMATOCRIT: 31.2 % — AB (ref 39.0–52.0)
HEMOGLOBIN: 9.9 g/dL — AB (ref 13.0–17.0)
MCH: 25.5 pg — ABNORMAL LOW (ref 26.0–34.0)
MCHC: 31.7 g/dL (ref 30.0–36.0)
MCV: 80.4 fL (ref 78.0–100.0)
Platelets: 146 10*3/uL — ABNORMAL LOW (ref 150–400)
RBC: 3.88 MIL/uL — AB (ref 4.22–5.81)
RDW: 17.2 % — ABNORMAL HIGH (ref 11.5–15.5)
WBC: 5.2 10*3/uL (ref 4.0–10.5)

## 2017-07-28 SURGERY — VITRECTOMY, USING 25-GAUGE INSTRUMENTS, WITH SCLERAL BUCKLING
Anesthesia: General | Site: Eye | Laterality: Right

## 2017-07-28 MED ORDER — HYPROMELLOSE (GONIOSCOPIC) 2.5 % OP SOLN
OPHTHALMIC | Status: DC | PRN
  Administered 2017-07-28: 1 [drp] via OPHTHALMIC

## 2017-07-28 MED ORDER — LIDOCAINE HCL (PF) 1 % IJ SOLN
INTRAMUSCULAR | Status: DC | PRN
  Administered 2017-07-28: 30 mL

## 2017-07-28 MED ORDER — PHENYLEPHRINE 40 MCG/ML (10ML) SYRINGE FOR IV PUSH (FOR BLOOD PRESSURE SUPPORT)
PREFILLED_SYRINGE | INTRAVENOUS | Status: AC
  Filled 2017-07-28: qty 10

## 2017-07-28 MED ORDER — CEFAZOLIN SODIUM 1 G IJ SOLR
INTRAMUSCULAR | Status: DC | PRN
  Administered 2017-07-28: 1 g via INTRAMUSCULAR

## 2017-07-28 MED ORDER — EPINEPHRINE PF 1 MG/ML IJ SOLN
INTRAMUSCULAR | Status: DC | PRN
  Administered 2017-07-28: 1 mg

## 2017-07-28 MED ORDER — ONDANSETRON HCL 4 MG/2ML IJ SOLN
INTRAMUSCULAR | Status: AC
  Filled 2017-07-28: qty 2

## 2017-07-28 MED ORDER — OXYCODONE HCL 5 MG PO TABS: 5.0000 mg | ORAL_TABLET | Freq: Three times a day (TID) | ORAL | 0 refills | Status: DC | PRN

## 2017-07-28 MED ORDER — DIPHENHYDRAMINE HCL 50 MG/ML IJ SOLN
INTRAMUSCULAR | Status: DC | PRN
  Administered 2017-07-28: 12.5 mg via INTRAVENOUS

## 2017-07-28 MED ORDER — PROMETHAZINE HCL 25 MG/ML IJ SOLN: 6.2500 mg | INTRAMUSCULAR | Status: DC | PRN

## 2017-07-28 MED ORDER — ROCURONIUM BROMIDE 10 MG/ML (PF) SYRINGE
PREFILLED_SYRINGE | INTRAVENOUS | Status: DC | PRN
  Administered 2017-07-28: 10 mg via INTRAVENOUS
  Administered 2017-07-28: 40 mg via INTRAVENOUS
  Administered 2017-07-28: 10 mg via INTRAVENOUS
  Administered 2017-07-28: 20 mg via INTRAVENOUS

## 2017-07-28 MED ORDER — DEXAMETHASONE SODIUM PHOSPHATE 10 MG/ML IJ SOLN
INTRAMUSCULAR | Status: DC | PRN
  Administered 2017-07-28: 10 mg via INTRAVENOUS

## 2017-07-28 MED ORDER — LIDOCAINE HCL (PF) 1 % IJ SOLN
INTRAMUSCULAR | Status: AC
  Filled 2017-07-28: qty 30

## 2017-07-28 MED ORDER — TOBRAMYCIN-DEXAMETHASONE 0.3-0.1 % OP OINT
TOPICAL_OINTMENT | OPHTHALMIC | Status: DC | PRN
  Administered 2017-07-28: 1 via OPHTHALMIC

## 2017-07-28 MED ORDER — SUGAMMADEX SODIUM 200 MG/2ML IV SOLN
INTRAVENOUS | Status: AC
  Filled 2017-07-28: qty 2

## 2017-07-28 MED ORDER — LIDOCAINE 2% (20 MG/ML) 5 ML SYRINGE
INTRAMUSCULAR | Status: DC | PRN
  Administered 2017-07-28: 100 mg via INTRAVENOUS

## 2017-07-28 MED ORDER — MIDAZOLAM HCL 2 MG/2ML IJ SOLN
INTRAMUSCULAR | Status: AC
  Filled 2017-07-28: qty 2

## 2017-07-28 MED ORDER — PROPOFOL 10 MG/ML IV BOLUS
INTRAVENOUS | Status: AC
  Filled 2017-07-28: qty 20

## 2017-07-28 MED ORDER — HYALURONIDASE HUMAN 150 UNIT/ML IJ SOLN
INTRAMUSCULAR | Status: AC
  Filled 2017-07-28: qty 1

## 2017-07-28 MED ORDER — LIDOCAINE 2% (20 MG/ML) 5 ML SYRINGE
INTRAMUSCULAR | Status: AC
  Filled 2017-07-28: qty 5

## 2017-07-28 MED ORDER — PROPOFOL 10 MG/ML IV BOLUS
INTRAVENOUS | Status: DC | PRN
  Administered 2017-07-28: 200 mg via INTRAVENOUS

## 2017-07-28 MED ORDER — TETRACAINE HCL 0.5 % OP SOLN
OPHTHALMIC | Status: AC
  Filled 2017-07-28: qty 4

## 2017-07-28 MED ORDER — BSS IO SOLN
INTRAOCULAR | Status: DC | PRN
  Administered 2017-07-28: 500 mL via INTRAOCULAR

## 2017-07-28 MED ORDER — FENTANYL CITRATE (PF) 100 MCG/2ML IJ SOLN
INTRAMUSCULAR | Status: DC | PRN
  Administered 2017-07-28 (×2): 50 ug via INTRAVENOUS

## 2017-07-28 MED ORDER — BSS IO SOLN
INTRAOCULAR | Status: AC
  Filled 2017-07-28: qty 15

## 2017-07-28 MED ORDER — HYALURONIDASE HUMAN 150 UNIT/ML IJ SOLN
INTRAMUSCULAR | Status: DC | PRN
  Administered 2017-07-28: 150 [IU] via SUBCUTANEOUS

## 2017-07-28 MED ORDER — SUGAMMADEX SODIUM 200 MG/2ML IV SOLN
INTRAVENOUS | Status: DC | PRN
  Administered 2017-07-28: 200 mg via INTRAVENOUS

## 2017-07-28 MED ORDER — STERILE WATER FOR INJECTION IJ SOLN
INTRAMUSCULAR | Status: DC | PRN
  Administered 2017-07-28: 10 mL via TOPICAL

## 2017-07-28 MED ORDER — BUPIVACAINE HCL (PF) 0.75 % IJ SOLN
INTRAMUSCULAR | Status: AC
  Filled 2017-07-28: qty 10

## 2017-07-28 MED ORDER — HYPROMELLOSE (GONIOSCOPIC) 2.5 % OP SOLN
OPHTHALMIC | Status: AC
  Filled 2017-07-28: qty 15

## 2017-07-28 MED ORDER — ROCURONIUM BROMIDE 10 MG/ML (PF) SYRINGE
PREFILLED_SYRINGE | INTRAVENOUS | Status: AC
  Filled 2017-07-28: qty 5

## 2017-07-28 MED ORDER — ATROPINE SULFATE 1 % OP SOLN
OPHTHALMIC | Status: AC
  Administered 2017-07-28: 1 [drp] via OPHTHALMIC
  Filled 2017-07-28: qty 5

## 2017-07-28 MED ORDER — VANCOMYCIN INTRAVITREAL INJECTION 1 MG/0.1 ML
INTRAOCULAR | Status: DC | PRN
  Administered 2017-07-28: 10 mg via INTRAVITREAL

## 2017-07-28 MED ORDER — ATROPINE SULFATE 1 % OP SOLN
1.0000 [drp] | OPHTHALMIC | Status: AC
  Administered 2017-07-28 (×3): 1 [drp] via OPHTHALMIC

## 2017-07-28 MED ORDER — HYDROMORPHONE HCL 1 MG/ML IJ SOLN: 0.2500 mg | INTRAMUSCULAR | Status: DC | PRN

## 2017-07-28 MED ORDER — TOBRAMYCIN-DEXAMETHASONE 0.3-0.1 % OP OINT
TOPICAL_OINTMENT | OPHTHALMIC | Status: AC
  Filled 2017-07-28: qty 3.5

## 2017-07-28 MED ORDER — CEFAZOLIN SODIUM-DEXTROSE 1-4 GM/50ML-% IV SOLN
INTRAVENOUS | Status: AC
  Filled 2017-07-28: qty 50

## 2017-07-28 MED ORDER — DEXAMETHASONE SODIUM PHOSPHATE 10 MG/ML IJ SOLN
INTRAMUSCULAR | Status: AC
  Filled 2017-07-28: qty 1

## 2017-07-28 MED ORDER — MIDAZOLAM HCL 2 MG/2ML IJ SOLN
INTRAMUSCULAR | Status: DC | PRN
  Administered 2017-07-28: 2 mg via INTRAVENOUS

## 2017-07-28 MED ORDER — FENTANYL CITRATE (PF) 250 MCG/5ML IJ SOLN
INTRAMUSCULAR | Status: AC
  Filled 2017-07-28: qty 5

## 2017-07-28 MED ORDER — SODIUM CHLORIDE 0.9 % IV SOLN
INTRAVENOUS | Status: DC
  Administered 2017-07-28 (×2): via INTRAVENOUS

## 2017-07-28 MED ORDER — CEFAZOLIN (ANCEF) 1 G IV SOLR
1.0000 g | INTRAVENOUS | Status: DC
  Filled 2017-07-28: qty 1

## 2017-07-28 MED ORDER — PHENYLEPHRINE HCL 2.5 % OP SOLN
1.0000 [drp] | OPHTHALMIC | Status: AC
  Administered 2017-07-28 (×3): 1 [drp] via OPHTHALMIC

## 2017-07-28 MED ORDER — DIPHENHYDRAMINE HCL 50 MG/ML IJ SOLN
INTRAMUSCULAR | Status: AC
  Filled 2017-07-28: qty 1

## 2017-07-28 MED ORDER — PHENYLEPHRINE 40 MCG/ML (10ML) SYRINGE FOR IV PUSH (FOR BLOOD PRESSURE SUPPORT)
PREFILLED_SYRINGE | INTRAVENOUS | Status: DC | PRN
  Administered 2017-07-28: 80 ug via INTRAVENOUS

## 2017-07-28 MED ORDER — BUPIVACAINE HCL (PF) 0.75 % IJ SOLN
INTRAMUSCULAR | Status: AC
  Filled 2017-07-28: qty 30

## 2017-07-28 MED ORDER — DEXAMETHASONE SODIUM PHOSPHATE 10 MG/ML IJ SOLN
INTRAMUSCULAR | Status: DC | PRN
  Administered 2017-07-28: 5 mg via INTRAVENOUS

## 2017-07-28 MED ORDER — PHENYLEPHRINE HCL 2.5 % OP SOLN
OPHTHALMIC | Status: AC
  Administered 2017-07-28: 1 [drp] via OPHTHALMIC
  Filled 2017-07-28: qty 2

## 2017-07-28 MED ORDER — BSS PLUS IO SOLN
INTRAOCULAR | Status: DC | PRN
  Administered 2017-07-28: 1 via INTRAOCULAR

## 2017-07-28 MED ORDER — VANCOMYCIN INTRAVITREAL INJECTION 1 MG/0.1 ML
10.0000 mg | INTRAOCULAR | Status: DC
  Filled 2017-07-28: qty 1

## 2017-07-28 MED ORDER — STERILE WATER FOR INJECTION IJ SOLN
INTRAMUSCULAR | Status: AC
  Filled 2017-07-28: qty 10

## 2017-07-28 MED ORDER — BUPIVACAINE HCL (PF) 0.75 % IJ SOLN
INTRAMUSCULAR | Status: DC | PRN
  Administered 2017-07-28: 30 mL

## 2017-07-28 MED ORDER — BSS PLUS IO SOLN
INTRAOCULAR | Status: AC
  Filled 2017-07-28: qty 500

## 2017-07-28 MED ORDER — BSS IO SOLN
INTRAOCULAR | Status: AC
  Filled 2017-07-28: qty 500

## 2017-07-28 SURGICAL SUPPLY — 55 items
APPLICATOR COTTON TIP 6IN STRL (MISCELLANEOUS) ×3 IMPLANT
APPLICATOR DR MATTHEWS STRL (MISCELLANEOUS) ×3 IMPLANT
BAND SCLERAL BUCKLING TYPE 41 (Ophthalmic Related) ×3 IMPLANT
BLADE MINI 60D BLUE (BLADE) ×3 IMPLANT
BLADE MINI RND TIP GREEN BEAV (BLADE) IMPLANT
BNDG COHESIVE 6X5 TAN STRL LF (GAUZE/BANDAGES/DRESSINGS) ×3 IMPLANT
CANNULA ANT CHAM MAIN (OPHTHALMIC RELATED) IMPLANT
CANNULA DUAL BORE 23G (CANNULA) IMPLANT
CANNULA VLV SOFT TIP 25GA (OPHTHALMIC) ×3 IMPLANT
CATH URET WHISTLE 8FR 331008 (CATHETERS) ×3 IMPLANT
CAUTERY EYE LOW TEMP 1300F FIN (OPHTHALMIC RELATED) ×3 IMPLANT
CORDS BIPOLAR (ELECTRODE) IMPLANT
COVER MAYO STAND STRL (DRAPES) ×3 IMPLANT
COVER SURGICAL LIGHT HANDLE (MISCELLANEOUS) ×3 IMPLANT
DRAPE HALF SHEET 40X57 (DRAPES) ×3 IMPLANT
FILTER BLUE MILLIPORE (MISCELLANEOUS) IMPLANT
FILTER STRAW FLUID ASPIR (MISCELLANEOUS) IMPLANT
GAS AUTO FILL CONSTEL (OPHTHALMIC) ×6
GAS AUTO FILL CONSTELLATION (OPHTHALMIC) ×2 IMPLANT
GAS OPHTHALMIC (MISCELLANEOUS) ×3 IMPLANT
GLOVE BIO SURGEON STRL SZ7.5 (GLOVE) ×3 IMPLANT
GOWN STRL REUS W/ TWL LRG LVL3 (GOWN DISPOSABLE) ×2 IMPLANT
GOWN STRL REUS W/TWL LRG LVL3 (GOWN DISPOSABLE) ×4
KIT BASIN OR (CUSTOM PROCEDURE TRAY) ×3 IMPLANT
KIT PERFLUORON PROCEDURE 5ML (MISCELLANEOUS) IMPLANT
KIT ROOM TURNOVER OR (KITS) ×3 IMPLANT
LENS BIOM SUPER VIEW SET DISP (OPHTHALMIC RELATED) ×3 IMPLANT
NEEDLE 18GX1X1/2 (RX/OR ONLY) (NEEDLE) ×6 IMPLANT
NEEDLE BLUNT 18X1 FOR OR ONLY (NEEDLE) ×3 IMPLANT
NEEDLE HYPO 25GX1X1/2 BEV (NEEDLE) IMPLANT
NEEDLE HYPO 30X.5 LL (NEEDLE) ×6 IMPLANT
NEEDLE RETROBULBAR 25GX1.5 (NEEDLE) ×3 IMPLANT
NS IRRIG 1000ML POUR BTL (IV SOLUTION) ×3 IMPLANT
PACK VITRECTOMY CUSTOM (CUSTOM PROCEDURE TRAY) ×3 IMPLANT
PAD ARMBOARD 7.5X6 YLW CONV (MISCELLANEOUS) ×6 IMPLANT
PAK PIK VITRECTOMY CVS 25GA (OPHTHALMIC) IMPLANT
PROBE ENDO DIATHERMY 25G (MISCELLANEOUS) ×6 IMPLANT
PROBE LASER ILLUM FLEX CVD 23G (OPHTHALMIC) IMPLANT
PROBE LASER ILLUM FLEX CVD 25G (OPHTHALMIC) ×6 IMPLANT
REPL STRA BRUSH NEEDLE (NEEDLE) IMPLANT
RESERVOIR BACK FLUSH (MISCELLANEOUS) IMPLANT
RETRACTOR IRIS FLEX 25G GRIESH (INSTRUMENTS) IMPLANT
ROLLS DENTAL (MISCELLANEOUS) ×6 IMPLANT
SET FLUID INJECTOR (SET/KITS/TRAYS/PACK) IMPLANT
STOCKINETTE IMPERVIOUS 9X36 MD (GAUZE/BANDAGES/DRESSINGS) ×6 IMPLANT
STOPCOCK 4 WAY LG BORE MALE ST (IV SETS) IMPLANT
SUT ETHILON 5.0 S-24 (SUTURE) ×3 IMPLANT
SUT SILK 2 0 (SUTURE) ×2
SUT SILK 2-0 18XBRD TIE 12 (SUTURE) ×1 IMPLANT
SUT VICRYL 7 0 TG140 8 (SUTURE) IMPLANT
SYR 20CC LL (SYRINGE) ×2 IMPLANT
SYRINGE 20CC LL (MISCELLANEOUS) ×3 IMPLANT
TOWEL OR 17X24 6PK STRL BLUE (TOWEL DISPOSABLE) ×6 IMPLANT
WATER STERILE IRR 1000ML POUR (IV SOLUTION) ×3 IMPLANT
WIPE INSTRUMENT VISIWIPE 73X73 (MISCELLANEOUS) IMPLANT

## 2017-07-28 NOTE — H&P (Signed)
Richard Bird is an 51 y.o. male.   Chief Complaint: vision loss OD HPI: diagnosed with macula off RD  Past Medical History:  Diagnosis Date  . Anemia    Iron deficiency anemia  . Cirrhosis of liver with ascites ([place]) 12/29/2016  . Hyponatremia    from MD note in Care Everywhere    Past Surgical History:  Procedure Laterality Date  . COLONOSCOPY    . HERNIA REPAIR    . KNEE SURGERY Left    arthroscopic  . TONSILLECTOMY      Family History  Problem Relation Age of Onset  . Heart disease Father    Social History:  reports that  has never smoked. he has never used smokeless tobacco. He reports that he does not drink alcohol or use drugs.  Allergies:  Allergies  Allergen Reactions  . Penicillins     UNSPECIFIED REACTION  PATIENT TOLERATES CEFTRIAXONE Has patient had a PCN reaction causing immediate rash, facial/tongue/throat swelling, SOB or lightheadedness with hypotension: Unsure Has patient had a PCN reaction causing severe rash involving mucus membranes or skin necrosis: Unsure Has patient had a PCN reaction that required hospitalization Unsure Has patient had a PCN reaction occurring within the last 10 years: No If all of the above answers are "NO", then may proceed with Cephalosporin [place]    Medications Prior to Admission  Medication Sig Dispense Refill  . folic acid (FOLVITE) 414 MCG tablet Take 800 mcg daily by mouth.    . gabapentin (NEURONTIN) 300 MG capsule Take 300 mg 3 (three) times daily by mouth.     . Multiple Vitamin (MULTIVITAMIN WITH MINERALS) TABS tablet Take 1 tablet by mouth daily. 30 tablet 0  . Soft Lens Products (B&L SENSITIVE EYES) SOLN Place 2 drops 2 (two) times daily into both eyes.    Marland Kitchen thiamine 100 MG tablet Take 1 tablet (100 mg total) by mouth daily. 30 tablet 0  . bisacodyl (DULCOLAX) 5 MG EC tablet Take 1 tablet (5 mg total) by mouth daily as needed for moderate constipation. (Patient not taking: Reported on 07/25/2017) 30 tablet 0  .  famotidine (PEPCID) 20 MG tablet Take 1 tablet (20 mg total) by mouth 2 (two) times daily. (Patient not taking: Reported on 07/25/2017) 60 tablet 0  . furosemide (LASIX) 40 MG tablet Take 0.5 tablets (20 mg total) by mouth daily. (Patient not taking: Reported on 07/25/2017) 30 tablet 0  . lactulose (CHRONULAC) 10 GM/15ML solution Take 45 mLs (30 g total) by mouth 2 (two) times daily. (Patient not taking: Reported on 07/25/2017) 240 mL 0    Results for orders placed or performed during the hospital encounter of 07/28/17 (from the past 48 hour(s))  Comprehensive metabolic panel     Status: Abnormal   Collection Time: 07/28/17  1:51 PM  Result Value Ref Range   Sodium 137 135 - 145 mmol/L   Potassium 4.1 3.5 - 5.1 mmol/L   Chloride 110 101 - 111 mmol/L   CO2 21 (L) 22 - 32 mmol/L   Glucose, Bld 85 65 - 99 mg/dL   BUN 10 6 - 20 mg/dL   Creatinine, Ser 0.89 0.61 - 1.24 mg/dL   Calcium 9.4 8.9 - 10.3 mg/dL   Total Protein 6.5 6.5 - 8.1 g/dL   Albumin 3.6 3.5 - 5.0 g/dL   AST 36 15 - 41 U/L   ALT 27 17 - 63 U/L   Alkaline Phosphatase 57 38 - 126 U/L   Total Bilirubin  1.6 (H) 0.3 - 1.2 mg/dL   GFR calc non Af Amer >60 >60 mL/min   GFR calc Af Amer >60 >60 mL/min    Comment: (NOTE) The eGFR has been calculated using the CKD EPI equation. This calculation has not been validated in all clinical situations. eGFR's persistently <60 mL/min signify possible Chronic Kidney Disease.    Anion gap 6 5 - 15  CBC     Status: Abnormal   Collection Time: 07/28/17  1:51 PM  Result Value Ref Range   WBC 5.2 4.0 - 10.5 K/uL   RBC 3.88 (L) 4.22 - 5.81 MIL/uL   Hemoglobin 9.9 (L) 13.0 - 17.0 g/dL   HCT 31.2 (L) 39.0 - 52.0 %   MCV 80.4 78.0 - 100.0 fL   MCH 25.5 (L) 26.0 - 34.0 pg   MCHC 31.7 30.0 - 36.0 g/dL   RDW 17.2 (H) 11.5 - 15.5 %   Platelets 146 (L) 150 - 400 K/uL   No results found.  Review of Systems  Eyes: Positive for blurred vision.  All other systems reviewed and are  negative.   Blood pressure (!) 154/75, pulse 81, temperature 98.2 F (36.8 C), temperature source Oral, resp. rate 20, height 6' (1.829 m), weight 97.5 kg (215 lb), SpO2 100 %. Physical Exam  Vitals reviewed. Constitutional: He appears well-developed and well-nourished.  Eyes:       Assessment/Plan 1. Mac off RD OD: PV/SBP/GFX OD  Corliss Parish, MD 07/28/2017, 4:14 PM

## 2017-07-28 NOTE — Anesthesia Postprocedure Evaluation (Signed)
Anesthesia Post Note  Patient: Richard InglesDaniel Bird  Procedure(s) Performed: VITRECTOMY 25 GAUGE WITH SCLERAL BUCKLE (Right Eye)     Patient location during evaluation: PACU Anesthesia Type: General Level of consciousness: awake and alert Pain management: pain level controlled Vital Signs Assessment: post-procedure vital signs reviewed and stable Respiratory status: spontaneous breathing, nonlabored ventilation and respiratory function stable Cardiovascular status: blood pressure returned to baseline and stable Postop Assessment: no apparent nausea or vomiting Anesthetic complications: no    Last Vitals:  Vitals:   07/28/17 1834 07/28/17 1849  BP: (!) 148/96 (!) 146/84  Pulse: 84 80  Resp: 20 18  Temp:    SpO2: (!) 89% 94%    Last Pain:  Vitals:   07/28/17 1850  TempSrc:   PainSc: 0-No pain                 Khameron Gruenwald,W. EDMOND

## 2017-07-28 NOTE — Transfer of Care (Signed)
Immediate Anesthesia Transfer of Care Note  Patient: Richard InglesDaniel Bird  Procedure(s) Performed: VITRECTOMY 25 GAUGE WITH SCLERAL BUCKLE (Right Eye)  Patient Location: PACU  Anesthesia Type:General  Level of Consciousness: awake, alert  and oriented  Airway & Oxygen Therapy: Patient Spontanous Breathing  Post-op Assessment: Report given to RN  Post vital signs: Reviewed and stable  Last Vitals:  Vitals:   07/28/17 1333  BP: (!) 154/75  Pulse: 81  Resp: 20  Temp: 36.8 C  SpO2: 100%    Last Pain:  Vitals:   07/28/17 1333  TempSrc: Oral      Patients Stated Pain Goal: 0 (07/28/17 1333)  Complications: No apparent anesthesia complications

## 2017-07-28 NOTE — Anesthesia Preprocedure Evaluation (Addendum)
Anesthesia Evaluation  Patient identified by MRN, date of birth, ID band Patient awake    Reviewed: Allergy & Precautions, NPO status , Patient's Chart, lab work & pertinent test results  History of Anesthesia Complications Negative for: history of anesthetic complications  Airway Mallampati: II  TM Distance: >3 FB Neck ROM: Full    Dental no notable dental hx. (+) Dental Advisory Given   Pulmonary neg pulmonary ROS,    Pulmonary exam normal breath sounds clear to auscultation       Cardiovascular negative cardio ROS Normal cardiovascular exam Rhythm:Regular Rate:Normal  Study Conclusions  - Left ventricle: The cavity size was normal. Wall thickness was   increased in a pattern of moderate LVH. Systolic function was   normal. The estimated ejection fraction was in the range of 60%   to 65%. Wall motion was normal; there were no regional wall   motion abnormalities. Left ventricular diastolic function   parameters were normal. - Mitral valve: Mildly calcified annulus. Mildly thickened leaflets   . There was moderate regurgitation. - Left atrium: The atrium was moderately to severely dilated. - Right atrium: The atrium was mildly dilated.   Neuro/Psych negative neurological ROS  negative psych ROS   GI/Hepatic negative GI ROS, (+) Cirrhosis     substance abuse  alcohol use,   Endo/Other  negative endocrine ROS  Renal/GU Renal diseasenegative Renal ROS  negative genitourinary   Musculoskeletal negative musculoskeletal ROS (+)   Abdominal   Peds negative pediatric ROS (+)  Hematology negative hematology ROS (+) anemia ,   Anesthesia Other Findings   Reproductive/Obstetrics negative OB ROS                            Anesthesia Physical Anesthesia Plan  ASA: III  Anesthesia Plan: General   Post-op Pain Management:    Induction: Intravenous  PONV Risk Score and Plan: 3 and  Ondansetron, Dexamethasone, Diphenhydramine and Midazolam  Airway Management Planned: Oral ETT  Additional Equipment:   Intra-op Plan:   Post-operative Plan: Extubation in OR  Informed Consent: I have reviewed the patients History and Physical, chart, labs and discussed the procedure including the risks, benefits and alternatives for the proposed anesthesia with the patient or authorized representative who has indicated his/her understanding and acceptance.   Dental advisory given  Plan Discussed with: CRNA and Anesthesiologist  Anesthesia Plan Comments:        Anesthesia Quick Evaluation

## 2017-07-28 NOTE — Anesthesia Procedure Notes (Signed)
Procedure Name: Intubation Date/Time: 07/28/2017 4:34 PM Performed by: Barrington Ellison, CRNA Pre-anesthesia Checklist: Patient identified, Emergency Drugs available, Suction available and Patient being monitored Patient Re-evaluated:Patient Re-evaluated prior to induction Oxygen Delivery Method: Circle System Utilized Preoxygenation: Pre-oxygenation with 100% oxygen Induction Type: IV induction Ventilation: Mask ventilation without difficulty Laryngoscope Size: Mac and 3 Grade View: Grade I Tube type: Oral Tube size: 7.5 mm Number of attempts: 1 Airway Equipment and Method: Stylet and Oral airway Placement Confirmation: ETT inserted through vocal cords under direct vision,  positive ETCO2 and breath sounds checked- equal and bilateral Secured at: 23 cm Tube secured with: Tape Dental Injury: Teeth and Oropharynx as per pre-operative assessment

## 2017-07-28 NOTE — Brief Op Note (Signed)
07/28/2017  6:27 PM  PATIENT:  Richard Bird  51 y.o. 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  POST-OPERATIVE DIAGNOSIS:   Retinal detatchment  PROCEDURE:  Procedure(s) with comments: VITRECTOMY 25 GAUGE WITH SCLERAL BUCKLE (Right) - Vitrectomy, scleral buckle, laser right eye  SURGEON:  Surgeon(s) and Role:    Stephannie Li* Cristan Hout, MD - Primary  PHYSICIAN ASSISTANT:   ASSISTANTS: none   ANESTHESIA:   none  EBL:  1 mL   BLOOD ADMINISTERED:none  DRAINS: none   LOCAL MEDICATIONS USED:  BUPIVICAINE   SPECIMEN:  No Specimen  DISPOSITION OF SPECIMEN:  N/A  COUNTS:  YES  TOURNIQUET:  * No tourniquets in log *  DICTATION: .Other Dictation: Dictation Number [id number]  PLAN OF CARE: Discharge to home after PACU  PATIENT DISPOSITION:  PACU - hemodynamically stable.   Delay start of Pharmacological VTE agent (>24hrs) due to surgical blood loss or risk of bleeding: not applicable

## 2017-07-29 ENCOUNTER — Encounter (HOSPITAL_COMMUNITY): Payer: Self-pay | Admitting: Ophthalmology

## 2017-07-29 NOTE — Op Note (Signed)
[facility]:  Bird, Richard            ACCOUNT NO.:  [id number]  MEDICAL RECORD NO.:  [id number]  LOCATION:  [facility]                         FACILITY:  [facility]  PHYSICIAN:  Donnel SaxonJason B Sinai Illingworth, MD    DATE OF BIRTH:  1965-11-02  DATE OF PROCEDURE:  07/28/2017 DATE OF DISCHARGE:  07/28/2017                              OPERATIVE REPORT   SURGEON:  Stephannie LiJason Nicolo Tomko, MD.  ANESTHESIA:  General with endotracheal intubation.  PREOPERATIVE DIAGNOSIS:  Retinal detachment, right eye.  POSTOPERATIVE DIAGNOSIS:  Retinal detachment, right eye.  OPERATION:  Scleral buckle with pars plana vitrectomy, gas fluid exchange, Endo cautery, endolaser with 14% C3F8 gas, right eye.  COMPLICATIONS:  None.  FINDINGS:  There was a retinal detachment with macula off.  DESCRIPTION OF PROCEDURE:  [name] was identified in the preoperative holding area.  He was then taken to the operating room, where he was placed under general anesthesia.  At that point in time, the right eye was anesthetized with a retrobulbar block consisting of a 1:1 mixture of 0.75% bupivacaine and 1% lidocaine.  The retrobulbar needle was removed.  The right eye was then prepped and draped in usual sterile fashion for ocular surgery including trimming of lashes.  A Lieberman speculum was then placed between the right upper and lower eyelids for exposure.  A 360 degree conjunctival peritomy was performed with 0.3 forceps and Westcott scissors to prepare the eye for standard scleral buckle.  Stevens tenotomy scissors were used to dissect tenon's capsule off the sclera in all 4 quadrants.  The 4 recti muscles were isolated and looped using 2-0 silk sutures.  The eye was then inspected using a Watzke retractor.  Calipers were set at 4.0 mm and used to mark locations of horizontal mattress sutures in each of the oblique quadrants.  The 5-0 nylon sutures were then placed in these locations in a horizontal mattress fashion.  The eye was then  encircled with a #41 scleral buckle with the ends of the buckle brought in the inferonasal quadrant and joined with a #70 sleeve.  Excess buckle material was discarded after the buckle height was adjusted.  5-0 nylon sutures were tied, the knots rotated posteriorly and trimmed.  At this point in time, attention was directed towards the vitrectomy portion of the case.  The 25-gauge trocars were used to introduce trans-scleral cannulas into the vitreous cavity in the inferior temporal, superior temporal, and superior nasal quadrants.  An infusion cannula was placed in the inferior temporal cannula and confirmed to be in the vitreous cavity prior to its use.  Next, the eye underwent a vitrectomy using the vitrector and light pipe.  Vitreous was cleared out to the periphery. Retinal breaks were identified in the 11 o'clock and 7 o'clock locations.  The 11 o'clock break was the cause of the retinal detachment.  Once the vitreous was cleared the retinal breaks were marked using Endo cautery.  Next, a soft tip extrusion cannula was used to perform an air-fluid exchange.  Residual posterior subretinal fluid was accessed by creating a small retinotomy using the Endo cautery probe.  The soft tipped extrusion cannula was then used to access and remove this remaining subretinal fluid.  Once this was completed, endolaser photocoagulation was applied around all retinal breaks and the retinotomy.  At this point in time, the eye looked excellent.  The eye was then flushed with a 14% concentration of C3F8 gas.  Intra-ocular pressure was assessed and found to be normal.  The cannulas were removed from the sclerotomies.  Sclerotomies were assessed and found to be airtight.  Therefore the 2-0 silk sutures were freed from the muscle insertions and the conjunctiva and tenon's capsule were reapproximated to the limbus using interrupted 7-0 Vicryl suture.  After this was completed, the eye was treated with 1 mg  of dexamethasone and 10 mg of vancomycin.  The eye was then treated with bacitracin ointment.  The speculum was removed.  The eyelids were cleaned and closed.  They were then patched and shielded.  The patient was taken to recovery in excellent condition having tolerated the procedure very well.     Donnel SaxonJason B Haralambos Yeatts, MD     JBS/MEDQ  D:  07/28/2017  T:  07/29/2017  Job:  [id number]

## 2017-08-26 ENCOUNTER — Other Ambulatory Visit: Payer: Self-pay | Admitting: Ophthalmology

## 2017-08-31 ENCOUNTER — Other Ambulatory Visit: Payer: Self-pay

## 2017-08-31 ENCOUNTER — Encounter (HOSPITAL_COMMUNITY): Payer: Self-pay | Admitting: *Deleted

## 2017-08-31 NOTE — Progress Notes (Signed)
Spoke with pt for pre-op call. Pt denies cardiac history, chest pain, HTN or diabetes.  

## 2017-09-01 ENCOUNTER — Other Ambulatory Visit: Payer: Self-pay

## 2017-09-01 ENCOUNTER — Ambulatory Visit (HOSPITAL_COMMUNITY): Payer: BLUE CROSS/BLUE SHIELD | Admitting: Anesthesiology

## 2017-09-01 ENCOUNTER — Encounter (HOSPITAL_COMMUNITY): Payer: Self-pay

## 2017-09-01 ENCOUNTER — Ambulatory Visit (HOSPITAL_COMMUNITY)
Admission: RE | Admit: 2017-09-01 | Discharge: 2017-09-01 | Disposition: A | Discharge status: Home / Self Care | Payer: BLUE CROSS/BLUE SHIELD | Source: Ambulatory Visit | Attending: Ophthalmology | Admitting: Ophthalmology

## 2017-09-01 ENCOUNTER — Encounter (HOSPITAL_COMMUNITY)
Admission: RE | Disposition: A | Discharge status: Home / Self Care | Payer: Self-pay | Source: Ambulatory Visit | Attending: Ophthalmology

## 2017-09-01 DIAGNOSIS — H3341 Traction detachment of retina, right eye: Secondary | ICD-10-CM | POA: Diagnosis present

## 2017-09-01 DIAGNOSIS — Z8249 Family history of ischemic heart disease and other diseases of the circulatory system: Secondary | ICD-10-CM | POA: Diagnosis not present

## 2017-09-01 DIAGNOSIS — K7460 Unspecified cirrhosis of liver: Secondary | ICD-10-CM | POA: Diagnosis not present

## 2017-09-01 DIAGNOSIS — Z79899 Other long term (current) drug therapy: Secondary | ICD-10-CM | POA: Diagnosis not present

## 2017-09-01 DIAGNOSIS — Z880 Allergy status to penicillin: Secondary | ICD-10-CM | POA: Diagnosis not present

## 2017-09-01 HISTORY — PX: PERFLUORONE INJECTION: SHX5302

## 2017-09-01 HISTORY — DX: Serous retinal detachment, unspecified eye: H33.20

## 2017-09-01 HISTORY — PX: INJECTION OF SILICONE OIL: SHX6422

## 2017-09-01 HISTORY — PX: PARS PLANA VITRECTOMY: SHX2166

## 2017-09-01 LAB — COMPREHENSIVE METABOLIC PANEL
ALT: 28 U/L (ref 17–63)
AST: 48 U/L — AB (ref 15–41)
Albumin: 3.9 g/dL (ref 3.5–5.0)
Alkaline Phosphatase: 60 U/L (ref 38–126)
Anion gap: 10 (ref 5–15)
BUN: 13 mg/dL (ref 6–20)
CHLORIDE: 105 mmol/L (ref 101–111)
CO2: 20 mmol/L — AB (ref 22–32)
CREATININE: 1 mg/dL (ref 0.61–1.24)
Calcium: 9.4 mg/dL (ref 8.9–10.3)
GFR calc Af Amer: >60 mL/min (ref >60)
GFR calc non Af Amer: >60 mL/min (ref >60)
GLUCOSE: 81 mg/dL (ref 65–99)
Potassium: 4.4 mmol/L (ref 3.5–5.1)
SODIUM: 135 mmol/L (ref 135–145)
Total Bilirubin: 2 mg/dL — ABNORMAL HIGH (ref 0.3–1.2)
Total Protein: 7.2 g/dL (ref 6.5–8.1)

## 2017-09-01 LAB — CBC
HCT: 33.2 % — ABNORMAL LOW (ref 39.0–52.0)
Hemoglobin: 10.5 g/dL — ABNORMAL LOW (ref 13.0–17.0)
MCH: 25.9 pg — AB (ref 26.0–34.0)
MCHC: 31.6 g/dL (ref 30.0–36.0)
MCV: 81.8 fL (ref 78.0–100.0)
PLATELETS: 182 10*3/uL (ref 150–400)
RBC: 4.06 MIL/uL — ABNORMAL LOW (ref 4.22–5.81)
RDW: 18.4 % — AB (ref 11.5–15.5)
WBC: 5.9 10*3/uL (ref 4.0–10.5)

## 2017-09-01 SURGERY — PARS PLANA VITRECTOMY WITH 25 GAUGE
Anesthesia: Monitor Anesthesia Care | Site: Eye | Laterality: Right

## 2017-09-01 MED ORDER — PROPOFOL 10 MG/ML IV BOLUS
INTRAVENOUS | Status: AC
  Filled 2017-09-01: qty 20

## 2017-09-01 MED ORDER — FENTANYL CITRATE (PF) 100 MCG/2ML IJ SOLN
INTRAMUSCULAR | Status: DC | PRN
  Administered 2017-09-01: 100 ug via INTRAVENOUS
  Administered 2017-09-01: 50 ug via INTRAVENOUS
  Administered 2017-09-01: 100 ug via INTRAVENOUS

## 2017-09-01 MED ORDER — ATROPINE SULFATE 1 % OP SOLN
1.0000 [drp] | OPHTHALMIC | Status: AC
  Administered 2017-09-01 (×3): 1 [drp] via OPHTHALMIC

## 2017-09-01 MED ORDER — DEXAMETHASONE SODIUM PHOSPHATE 10 MG/ML IJ SOLN
INTRAMUSCULAR | Status: DC | PRN
  Administered 2017-09-01: 10 mg

## 2017-09-01 MED ORDER — 0.9 % SODIUM CHLORIDE (POUR BTL) OPTIME
TOPICAL | Status: DC | PRN
  Administered 2017-09-01: 200 mL

## 2017-09-01 MED ORDER — STERILE WATER FOR IRRIGATION IR SOLN
Status: DC | PRN
  Administered 2017-09-01: 200 mL

## 2017-09-01 MED ORDER — LIDOCAINE HCL 2 % IJ SOLN
INTRAMUSCULAR | Status: DC | PRN
  Administered 2017-09-01: 14:00:00 via RETROBULBAR

## 2017-09-01 MED ORDER — HYALURONIDASE HUMAN 150 UNIT/ML IJ SOLN
INTRAMUSCULAR | Status: AC
  Filled 2017-09-01: qty 1

## 2017-09-01 MED ORDER — HYPROMELLOSE (GONIOSCOPIC) 2.5 % OP SOLN
OPHTHALMIC | Status: DC | PRN
  Administered 2017-09-01: 2 [drp] via OPHTHALMIC

## 2017-09-01 MED ORDER — ATROPINE SULFATE 1 % OP SOLN
1.0000 [drp] | OPHTHALMIC | Status: DC
  Filled 2017-09-01: qty 5

## 2017-09-01 MED ORDER — SODIUM CHLORIDE 0.9 % IV SOLN
INTRAVENOUS | Status: DC
  Administered 2017-09-01 (×2): via INTRAVENOUS

## 2017-09-01 MED ORDER — DEXAMETHASONE SODIUM PHOSPHATE 10 MG/ML IJ SOLN
INTRAMUSCULAR | Status: AC
  Filled 2017-09-01: qty 1

## 2017-09-01 MED ORDER — FENTANYL CITRATE (PF) 250 MCG/5ML IJ SOLN
INTRAMUSCULAR | Status: AC
  Filled 2017-09-01: qty 5

## 2017-09-01 MED ORDER — ONDANSETRON HCL 4 MG/2ML IJ SOLN
INTRAMUSCULAR | Status: AC
  Filled 2017-09-01: qty 2

## 2017-09-01 MED ORDER — PHENYLEPHRINE HCL 2.5 % OP SOLN
1.0000 [drp] | OPHTHALMIC | Status: AC
  Administered 2017-09-01 (×3): 1 [drp] via OPHTHALMIC

## 2017-09-01 MED ORDER — MIDAZOLAM HCL 5 MG/5ML IJ SOLN
INTRAMUSCULAR | Status: DC | PRN
Start: 2017-09-01 — End: 2017-09-01
  Administered 2017-09-01: 2 mg via INTRAVENOUS

## 2017-09-01 MED ORDER — TOBRAMYCIN-DEXAMETHASONE 0.3-0.1 % OP OINT
TOPICAL_OINTMENT | OPHTHALMIC | Status: AC
  Filled 2017-09-01: qty 3.5

## 2017-09-01 MED ORDER — ATROPINE SULFATE 1 % OP SOLN
OPHTHALMIC | Status: AC
  Filled 2017-09-01: qty 5

## 2017-09-01 MED ORDER — MIDAZOLAM HCL 2 MG/2ML IJ SOLN
INTRAMUSCULAR | Status: AC
  Filled 2017-09-01: qty 2

## 2017-09-01 MED ORDER — PROMETHAZINE HCL 25 MG/ML IJ SOLN: 6.2500 mg | INTRAMUSCULAR | Status: DC | PRN

## 2017-09-01 MED ORDER — VANCOMYCIN SUBCONJUNCTIVAL INJECTION 25 MG/0.5 ML
25.0000 mg | INTRAOCULAR | Status: DC
  Filled 2017-09-01: qty 0.5

## 2017-09-01 MED ORDER — BSS PLUS IO SOLN
INTRAOCULAR | Status: AC
  Filled 2017-09-01: qty 500

## 2017-09-01 MED ORDER — BUPIVACAINE HCL (PF) 0.75 % IJ SOLN
INTRAMUSCULAR | Status: AC
Start: 2017-09-01 — End: 2017-09-01
  Filled 2017-09-01: qty 10

## 2017-09-01 MED ORDER — EPINEPHRINE PF 1 MG/ML IJ SOLN
INTRAMUSCULAR | Status: AC
  Filled 2017-09-01: qty 1

## 2017-09-01 MED ORDER — ONDANSETRON HCL 4 MG/2ML IJ SOLN
INTRAMUSCULAR | Status: DC | PRN
  Administered 2017-09-01: 4 mg via INTRAVENOUS

## 2017-09-01 MED ORDER — TOBRAMYCIN-DEXAMETHASONE 0.3-0.1 % OP OINT
TOPICAL_OINTMENT | OPHTHALMIC | Status: DC | PRN
  Administered 2017-09-01: 1 via OPHTHALMIC

## 2017-09-01 MED ORDER — BSS IO SOLN
INTRAOCULAR | Status: AC
  Filled 2017-09-01: qty 15

## 2017-09-01 MED ORDER — VANCOMYCIN SUBCONJUNCTIVAL INJECTION 25 MG/0.5 ML
25.0000 mg | INTRAOCULAR | Status: AC
  Administered 2017-09-01: 25 mg via SUBCONJUNCTIVAL
  Filled 2017-09-01: qty 0.5

## 2017-09-01 MED ORDER — EPINEPHRINE PF 1 MG/ML IJ SOLN
INTRAOCULAR | Status: DC | PRN
  Administered 2017-09-01: 14:00:00

## 2017-09-01 MED ORDER — LIDOCAINE HCL 2 % IJ SOLN
INTRAMUSCULAR | Status: AC
Start: 2017-09-01 — End: 2017-09-01
  Filled 2017-09-01: qty 20

## 2017-09-01 MED ORDER — PHENYLEPHRINE HCL 2.5 % OP SOLN
1.0000 [drp] | OPHTHALMIC | Status: DC
  Filled 2017-09-01: qty 2

## 2017-09-01 MED ORDER — HYDROMORPHONE HCL 1 MG/ML IJ SOLN: 0.2500 mg | INTRAMUSCULAR | Status: DC | PRN

## 2017-09-01 MED ORDER — PROPOFOL 10 MG/ML IV BOLUS
INTRAVENOUS | Status: DC | PRN
  Administered 2017-09-01: 60 mg via INTRAVENOUS

## 2017-09-01 MED ORDER — LIDOCAINE 2% (20 MG/ML) 5 ML SYRINGE
INTRAMUSCULAR | Status: DC | PRN
  Administered 2017-09-01: 100 mg via INTRAVENOUS

## 2017-09-01 MED ORDER — HYPROMELLOSE (GONIOSCOPIC) 2.5 % OP SOLN
OPHTHALMIC | Status: AC
Start: 2017-09-01 — End: 2017-09-01
  Filled 2017-09-01: qty 15

## 2017-09-01 MED ORDER — TETRACAINE HCL 0.5 % OP SOLN
OPHTHALMIC | Status: AC
  Filled 2017-09-01: qty 4

## 2017-09-01 MED ORDER — LIDOCAINE 2% (20 MG/ML) 5 ML SYRINGE
INTRAMUSCULAR | Status: AC
  Filled 2017-09-01: qty 5

## 2017-09-01 SURGICAL SUPPLY — 62 items
BLADE MVR KNIFE 20G (BLADE) IMPLANT
CANNULA ANT CHAM MAIN (OPHTHALMIC RELATED) IMPLANT
CANNULA ANTERIOR CHAMBER 27GA (MISCELLANEOUS) IMPLANT
CANNULA DUAL BORE 23G (CANNULA) IMPLANT
CANNULA DUALBORE 25G (CANNULA) ×3 IMPLANT
CANNULA VLV SOFT TIP 25GA (OPHTHALMIC) ×3 IMPLANT
CAUTERY EYE LOW TEMP 1300F FIN (OPHTHALMIC RELATED) IMPLANT
CLOSURE STERI-STRIP 1/2X4 (GAUZE/BANDAGES/DRESSINGS) ×1
CLSR STERI-STRIP ANTIMIC 1/2X4 (GAUZE/BANDAGES/DRESSINGS) ×2 IMPLANT
CORD BIPOLAR FORCEPS 12FT (ELECTRODE) ×3 IMPLANT
COVER MAYO STAND STRL (DRAPES) IMPLANT
DRAPE HALF SHEET 40X57 (DRAPES) ×3 IMPLANT
DRAPE INCISE 51X51 W/FILM STRL (DRAPES) IMPLANT
DRAPE RETRACTOR (MISCELLANEOUS) ×3 IMPLANT
ERASER HMR WETFIELD 23G BP (MISCELLANEOUS) IMPLANT
FILTER BLUE MILLIPORE (MISCELLANEOUS) IMPLANT
FORCEPS ECKARDT ILM 25G SERR (OPHTHALMIC RELATED) IMPLANT
FORCEPS GRIESHABER ILM 25G A (INSTRUMENTS) IMPLANT
GAS AUTO FILL CONSTEL (OPHTHALMIC)
GAS AUTO FILL CONSTELLATION (OPHTHALMIC) IMPLANT
GLOVE BIO SURGEON STRL SZ7.5 (GLOVE) ×3 IMPLANT
GLOVE SURG SS PI 6.5 STRL IVOR (GLOVE) ×3 IMPLANT
GOWN STRL REUS W/ TWL LRG LVL3 (GOWN DISPOSABLE) ×2 IMPLANT
GOWN STRL REUS W/TWL LRG LVL3 (GOWN DISPOSABLE) ×4
HANDLE PNEUMATIC FOR CONSTEL (OPHTHALMIC) IMPLANT
KIT BASIN OR (CUSTOM PROCEDURE TRAY) ×3 IMPLANT
KIT PERFLUORON PROCEDURE 5ML (MISCELLANEOUS) ×3 IMPLANT
KIT ROOM TURNOVER OR (KITS) IMPLANT
LENS BIOM SUPER VIEW SET DISP (OPHTHALMIC RELATED) ×3 IMPLANT
MICROPICK 25G (MISCELLANEOUS)
NEEDLE 18GX1X1/2 (RX/OR ONLY) (NEEDLE) ×3 IMPLANT
NEEDLE 25GX 5/8IN NON SAFETY (NEEDLE) ×6 IMPLANT
NEEDLE FILTER BLUNT 18X 1/2SAF (NEEDLE) ×2
NEEDLE FILTER BLUNT 18X1 1/2 (NEEDLE) ×1 IMPLANT
NEEDLE HYPO 25GX1X1/2 BEV (NEEDLE) IMPLANT
NEEDLE HYPO 30X.5 LL (NEEDLE) ×6 IMPLANT
NEEDLE RETROBULBAR 25GX1.5 (NEEDLE) ×3 IMPLANT
NS IRRIG 1000ML POUR BTL (IV SOLUTION) ×3 IMPLANT
OIL SILICONE OPHTHALMIC 1000 (Ophthalmic Related) ×3 IMPLANT
PACK CATARACT CUSTOM (CUSTOM PROCEDURE TRAY) ×3 IMPLANT
PAD ARMBOARD 7.5X6 YLW CONV (MISCELLANEOUS) ×6 IMPLANT
PAK PIK VITRECTOMY CVS 25GA (OPHTHALMIC) IMPLANT
PAK VITRECTOMY PIK 25 GA (OPHTHALMIC RELATED) ×3 IMPLANT
PENCIL BIPOLAR 25GA STR DISP (OPHTHALMIC RELATED) IMPLANT
PICK MICROPICK 25G (MISCELLANEOUS) IMPLANT
PROBE LASER ILLUM FLEX CVD 25G (OPHTHALMIC) ×3 IMPLANT
ROLLS DENTAL (MISCELLANEOUS) IMPLANT
SCRAPER DIAMOND 25GA (OPHTHALMIC RELATED) IMPLANT
SET INJECTOR OIL FLUID CONSTEL (OPHTHALMIC) ×3 IMPLANT
STOCKINETTE IMPERVIOUS 9X36 MD (GAUZE/BANDAGES/DRESSINGS) ×6 IMPLANT
STOPCOCK 4 WAY LG BORE MALE ST (IV SETS) IMPLANT
SUT ETHILON 8 0 TG100 8 (SUTURE) IMPLANT
SUT VICRYL 7 0 TG140 8 (SUTURE) IMPLANT
SUT VICRYL 8 0 TG140 8 (SUTURE) IMPLANT
SUT VICRYL ABS 6-0 S29 18IN (SUTURE) IMPLANT
SYR 10ML LL (SYRINGE) IMPLANT
SYR 20CC LL (SYRINGE) ×3 IMPLANT
SYR 5ML LL (SYRINGE) ×3 IMPLANT
SYR TB 1ML LUER SLIP (SYRINGE) IMPLANT
TUBE CONNECTING 12'X1/4 (SUCTIONS)
TUBE CONNECTING 12X1/4 (SUCTIONS) IMPLANT
WATER STERILE IRR 1000ML POUR (IV SOLUTION) ×3 IMPLANT

## 2017-09-01 NOTE — Brief Op Note (Signed)
09/01/2017  2:53 PM  PATIENT:  Richard Bird  51 y.o. male  PRE-OPERATIVE DIAGNOSIS:  H33.011  POST-OPERATIVE DIAGNOSIS:  * No post-op diagnosis entered *  PROCEDURE:  Procedure(s) with comments: PARS PLANA VITRECTOMY WITH 25 GAUGE (Right) - Vitrectomy with Silicone Oil  SURGEON:  Surgeon(s) and Role:    Sherlynn Stalls, MD - Primary  PHYSICIAN ASSISTANT:   ASSISTANTS: none   ANESTHESIA:   MAC  EBL:  0.3 mL   BLOOD ADMINISTERED:none  DRAINS: none   LOCAL MEDICATIONS USED:  MARCAINE     SPECIMEN:  No Specimen  DISPOSITION OF SPECIMEN:  N/A  COUNTS:  yes  TOURNIQUET:  * No tourniquets in log *  DICTATION: .Other Dictation: Dictation Number [id number]  PLAN OF CARE: Discharge to home after PACU  PATIENT DISPOSITION:  PACU - hemodynamically stable.   Delay start of Pharmacological VTE agent (>24hrs) due to surgical blood loss or risk of bleeding: not applicable

## 2017-09-01 NOTE — Transfer of Care (Signed)
Immediate Anesthesia Transfer of Care Note  Patient: Richard Bird  Procedure(s) Performed: PARS PLANA VITRECTOMY WITH 25 GAUGE (Right )  Patient Location: PACU  Anesthesia Type:MAC  Level of Consciousness: awake, alert , oriented and patient cooperative  Airway & Oxygen Therapy: Patient Spontanous Breathing  Post-op Assessment: Report given to RN, Post -op Vital signs reviewed and stable and Patient moving all extremities  Post vital signs: Reviewed and stable  Last Vitals:  Vitals:   09/01/17 1033 09/01/17 1450  BP: 123/72 129/76  Pulse: 74 75  Resp: 19 16  Temp: 36.6 C 37.1 C  SpO2: 99%     Last Pain:  Vitals:   09/01/17 1033  TempSrc: Oral         Complications: No apparent anesthesia complications

## 2017-09-01 NOTE — Anesthesia Preprocedure Evaluation (Addendum)
Anesthesia Evaluation  Patient identified by MRN, date of birth, ID band Patient awake    Reviewed: Allergy & Precautions, NPO status , Patient's Chart, lab work & pertinent test results  History of Anesthesia Complications Negative for: history of anesthetic complications  Airway Mallampati: II  TM Distance: >3 FB Neck ROM: Full    Dental no notable dental hx. (+) Dental Advisory Given   Pulmonary neg pulmonary ROS,    Pulmonary exam normal breath sounds clear to auscultation       Cardiovascular negative cardio ROS Normal cardiovascular exam Rhythm:Regular Rate:Normal  Study Conclusions  - Left ventricle: The cavity size was normal. Wall thickness was   increased in a pattern of moderate LVH. Systolic function was   normal. The estimated ejection fraction was in the range of 60%   to 65%. Wall motion was normal; there were no regional wall   motion abnormalities. Left ventricular diastolic function   parameters were normal. - Mitral valve: Mildly calcified annulus. Mildly thickened leaflets   . There was moderate regurgitation. - Left atrium: The atrium was moderately to severely dilated. - Right atrium: The atrium was mildly dilated.   Neuro/Psych negative neurological ROS  negative psych ROS   GI/Hepatic negative GI ROS, (+) Cirrhosis     substance abuse  alcohol use,   Endo/Other  negative endocrine ROS  Renal/GU Renal diseasenegative Renal ROS  negative genitourinary   Musculoskeletal negative musculoskeletal ROS (+)   Abdominal   Peds negative pediatric ROS (+)  Hematology negative hematology ROS (+) anemia ,   Anesthesia Other Findings   Reproductive/Obstetrics negative OB ROS                             Anesthesia Physical  Anesthesia Plan  ASA: III  Anesthesia Plan: MAC   Post-op Pain Management:    Induction: Intravenous  PONV Risk Score and Plan: 2 and  Ondansetron and Dexamethasone  Airway Management Planned: Natural Airway and Nasal Cannula  Additional Equipment:   Intra-op Plan:   Post-operative Plan:   Informed Consent: I have reviewed the patients History and Physical, chart, labs and discussed the procedure including the risks, benefits and alternatives for the proposed anesthesia with the patient or authorized representative who has indicated his/her understanding and acceptance.   Dental advisory given  Plan Discussed with: CRNA and Anesthesiologist  Anesthesia Plan Comments:        Anesthesia Quick Evaluation

## 2017-09-01 NOTE — H&P (Signed)
Richard Bird is an 51 y.o. male.   Chief Complaint: visual loss OD  HPI: previous RD repair with recurrent RD OD  Past Medical History:  Diagnosis Date  . Anemia    Iron deficiency anemia  . Cirrhosis of liver with ascites ([place]) 12/29/2016  . Hyponatremia    from MD note in Care Everywhere  . Retinal detachment    right    Past Surgical History:  Procedure Laterality Date  . COLONOSCOPY    . HERNIA REPAIR    . KNEE SURGERY Left    arthroscopic  . TONSILLECTOMY    . VITRECTOMY 25 GAUGE WITH SCLERAL BUCKLE Right 07/28/2017   Procedure: VITRECTOMY 25 GAUGE WITH SCLERAL BUCKLE;  Surgeon: Sherlynn Stalls, MD;  Location: [facility];  Service: Ophthalmology;  Laterality: Right;  Vitrectomy, scleral buckle, laser right eye    Family History  Problem Relation Age of Onset  . Heart disease Father    Social History:  reports that  has never smoked. he has never used smokeless tobacco. He reports that he does not drink alcohol or use drugs.  Allergies:  Allergies  Allergen Reactions  . Penicillins     UNSPECIFIED REACTION  PATIENT TOLERATES CEFTRIAXONE Has patient had a PCN reaction causing immediate rash, facial/tongue/throat swelling, SOB or lightheadedness with hypotension: Unsure Has patient had a PCN reaction causing severe rash involving mucus membranes or skin necrosis: Unsure Has patient had a PCN reaction that required hospitalization Unsure Has patient had a PCN reaction occurring within the last 10 years: No If all of the above answers are "NO", then may proceed with Cephalosporin [place]    Medications Prior to Admission  Medication Sig Dispense Refill  . Cyanocobalamin (VITAMIN B-12 PO) Take 1 tablet by mouth daily.    . Difluprednate (DUREZOL OP) Apply 1 drop to eye 2 (two) times daily. Right eye only    . dorzolamide (TRUSOPT) 2 % ophthalmic solution Place 1 drop into the right eye 2 (two) times daily.    . folic acid (FOLVITE) 409 MCG tablet Take 800 mcg daily by mouth.     . gabapentin (NEURONTIN) 300 MG capsule Take 300 mg 3 (three) times daily by mouth.     . Multiple Vitamin (MULTIVITAMIN WITH MINERALS) TABS tablet Take 1 tablet by mouth daily. 30 tablet 0  . ofloxacin (OCUFLOX) 0.3 % ophthalmic solution Place 1 drop into the right eye every 4 (four) hours.    Marland Kitchen oxyCODONE (ROXICODONE) 5 MG immediate release tablet Take 1 tablet (5 mg total) every 8 (eight) hours as needed for up to 10 doses by mouth for moderate pain. 10 tablet 0  . Soft Lens Products (B&L SENSITIVE EYES) SOLN Place 2 drops 2 (two) times daily into both eyes.    Marland Kitchen thiamine 100 MG tablet Take 1 tablet (100 mg total) by mouth daily. (Patient not taking: Reported on 09/01/2017) 30 tablet 0    Results for orders placed or performed during the hospital encounter of 09/01/17 (from the past 48 hour(s))  Comprehensive metabolic panel     Status: Abnormal   Collection Time: 09/01/17 10:28 AM  Result Value Ref Range   Sodium 135 135 - 145 mmol/L   Potassium 4.4 3.5 - 5.1 mmol/L    Comment: SLIGHT HEMOLYSIS   Chloride 105 101 - 111 mmol/L   CO2 20 (L) 22 - 32 mmol/L   Glucose, Bld 81 65 - 99 mg/dL   BUN 13 6 - 20 mg/dL   Creatinine,  Ser 1.00 0.61 - 1.24 mg/dL   Calcium 9.4 8.9 - 10.3 mg/dL   Total Protein 7.2 6.5 - 8.1 g/dL   Albumin 3.9 3.5 - 5.0 g/dL   AST 48 (H) 15 - 41 U/L   ALT 28 17 - 63 U/L   Alkaline Phosphatase 60 38 - 126 U/L   Total Bilirubin 2.0 (H) 0.3 - 1.2 mg/dL   GFR calc non Af Amer >60 >60 mL/min   GFR calc Af Amer >60 >60 mL/min    Comment: (NOTE) The eGFR has been calculated using the CKD EPI equation. This calculation has not been validated in all clinical situations. eGFR's persistently <60 mL/min signify possible Chronic Kidney Disease.    Anion gap 10 5 - 15  CBC     Status: Abnormal   Collection Time: 09/01/17 10:28 AM  Result Value Ref Range   WBC 5.9 4.0 - 10.5 K/uL   RBC 4.06 (L) 4.22 - 5.81 MIL/uL   Hemoglobin 10.5 (L) 13.0 - 17.0 g/dL   HCT 33.2 (L)  39.0 - 52.0 %   MCV 81.8 78.0 - 100.0 fL   MCH 25.9 (L) 26.0 - 34.0 pg   MCHC 31.6 30.0 - 36.0 g/dL   RDW 18.4 (H) 11.5 - 15.5 %   Platelets 182 150 - 400 K/uL   No results found.  Review of Systems  Eyes: Positive for blurred vision.  All other systems reviewed and are negative.   Blood pressure 123/72, pulse 74, temperature 97.9 F (36.6 C), temperature source Oral, resp. rate 19, height 5' 11"  (1.803 m), weight 95.3 kg (210 lb), SpO2 99 %. Physical Exam  Nursing note and vitals reviewed. Constitutional: He appears well-developed and well-nourished.  Eyes: Right conjunctiva has a hemorrhage.       Assessment/Plan 1. RD OD s/p PPV/SBP/GX: plan PPV/ retinectomy, silicone oil OD  Corliss Parish, MD 09/01/2017, 12:41 PM

## 2017-09-02 ENCOUNTER — Encounter (HOSPITAL_COMMUNITY): Payer: Self-pay | Admitting: Ophthalmology

## 2017-09-02 NOTE — Op Note (Signed)
Richard Bird, Richard Bird NO.:  [id number]  MEDICAL RECORD NO.:  [id number]  LOCATION:  [facility]                        FACILITY:  [facility]  PHYSICIAN:  Corliss Parish, MD    DATE OF BIRTH:  07/15/1966  DATE OF PROCEDURE:  09/01/2017 DATE OF DISCHARGE:                              OPERATIVE REPORT   SURGEON:  Sherlynn Stalls, MD.  ANESTHESIA:  MAC anesthesia with right eye.  PREOPERATIVE DIAGNOSES:  Retinal detachment, right eye.  POSTOPERATIVE DIAGNOSIS:  Retinal detachment, right eye.  OPERATION PROCEDURE:  A 25-gauge pars plana vitrectomy with inferior retinectomy, endo cautery, endolaser, silicone oil placement, right eye.  COMPLICATIONS:  None.  FINDINGS:  There was an inferior retinal detachment with early proliferative vitreoretinopathy.  DESCRIPTION OF PROCEDURE:  The patient was identified in the preoperative holding area.  He was then taken to the operating room, where he was sedated by anesthesia.  At that point in time, the right eye was anesthetized using retrobulbar block using 1:1 mixture of 0.75% bupivacaine, 1% lidocaine, and 150 units of Hylenex.  After the anesthetic was injected into the orbital space, needle was removed from the orbit.  After excellent akinesia and anesthesia were obtained, the right eye was prepped and draped in usual sterile fashion for ocular surgery including trimming of lashes.  A Lieberman speculum was placed in the right upper and lower eyelid for exposure.  After the speculum was in position, a 25-gauge trocar was used to place a 25-gauge cannula transconjunctivally in the superotemporal, superonasal, and the inferior temporal periphery.  This was placed without difficulty and the trocar was removed leaving the cannula in place.  An infusion cannula was attached to the inferior temporal cannula and confirmed to be in the vitreous cavity prior to its use.  Next, the eye underwent vitrectomy with vitreous cutter and  light pipe.  Vitreous was mostly removed from previous surgery.  Gas bubble was slowly removed using the vitrector. An inferior retinal detachment was identified with a large retinal break.  The retinal break had occurred at the junction of laser and non- laser retina.  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  Due to foreshortening of the retina, the decision was made to create a small inferior retinectomy.  Therefore, endo cautery was used to demarcate an inferior retinectomy in the peripheral inferior retina.  Once this was completed, the vitrector was used to remove the retinal tissue anterior to the cautery line.  This was performed without difficulty and allowed the retina to relax in a very comfortable natural position.  Next, a dual bore cannula was used to gently place Perfluoron in the posterior pole, which helped flatten the retina completely.  Once this was performed, endolaser photocoagulation was placed along the edge of the retinotomy sealing the retina in position.  Next a soft-tip extrusion cannula was used to perform an air-fluid exchange allowing the Perfluoron to be removed from the eye.  At this point in time, additional laser was placed using endolaser photocoagulation.  The laser probe was then removed.  Next, viscous fluid injector was used to inject silicone oil, [address] [place] in the vitreous cavity.  This was brought up to the lens iris diaphragm.  Once this completed, the injector was removed.  The cannulas were then removed from the sclerotomies.  The sclerotomies were inspected and found to be sealed tight, therefore, additional suturing was not necessary.  Westcott scissors and forceps were used to remove some previous suture from previous eye surgery.  Two Vicryl sutures were removed in this manner and discarded.  The eye was then treated subconjunctivally with injections of 15 mg of vancomycin and 1 mg of dexamethasone.  The eye was then treated  topically with 1 drop of 1% atropine and TobraDex ointment.  The speculum was removed, and the eyelids were cleaned and closed.  Eye was then patched and shielded.  The patient was taken to the recovery room in excellent condition having tolerated the procedure well.     Corliss Parish, MD     JBS/MEDQ  D:  09/01/2017  T:  09/02/2017  Job:  [id number]

## 2017-09-02 NOTE — Anesthesia Postprocedure Evaluation (Signed)
Anesthesia Post Note  Patient: Richard Bird  Procedure(s) Performed: RIGHT EYE PARS PLANA VITRECTOMY WITH 25 GAUGE (Right Eye) INJECTION OF SILICONE OIL RIGHT EYE (Right Eye) PERFLUORONE INJECTION RIGHT EYE (Right Eye)     Patient location during evaluation: PACU Anesthesia Type: MAC Level of consciousness: awake and alert Pain management: pain level controlled Vital Signs Assessment: post-procedure vital signs reviewed and stable Respiratory status: spontaneous breathing, nonlabored ventilation, respiratory function stable and patient connected to nasal cannula oxygen Cardiovascular status: stable and blood pressure returned to baseline Postop Assessment: no apparent nausea or vomiting Anesthetic complications: no    Last Vitals:  Vitals:   09/01/17 1515 09/01/17 1530  BP: 120/80 129/78  Pulse: 87 87  Resp: 13 14  Temp:  37.1 C  SpO2: 97% 98%    Last Pain:  Vitals:   09/01/17 1530  TempSrc:   PainSc: 0-No pain                 Sanah Kraska,JAMES TERRILL

## 2018-03-10 IMAGING — CT CT ABD-PELV W/ CM
2 of 5 series · 15 of 46 positions shown, 17 images · IV contrast (ISOVUE)
Comparison: None available.

CLINICAL DATA: Initial evaluation for acute generalized weakness
with fatigue. Evaluate for cirrhosis.

EXAM:
CT ABDOMEN AND PELVIS WITH CONTRAST
TECHNIQUE: Multidetector CT imaging of the abdomen and pelvis was performed
using the standard protocol following bolus administration of
intravenous contrast.
CONTRAST:  100mL TUVPPI-6EE IOPAMIDOL (TUVPPI-6EE) INJECTION 61%

[Series 2: abd/pel with · axial · 0.98mm/px · z∈[-376,+104]mm · 12 of 113 slices shown, 14 images]
[im 9/113  soft-tissue]
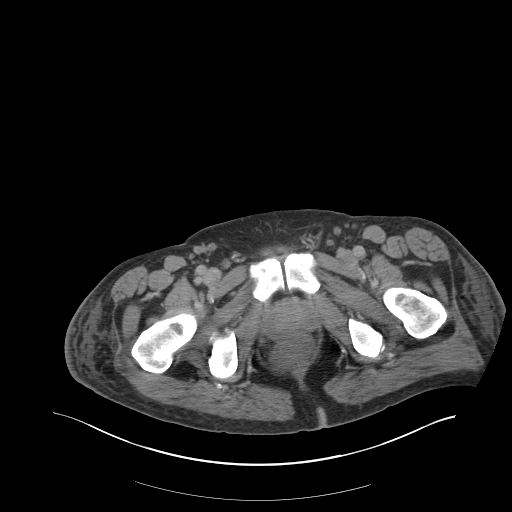
[im 9/113  bone]
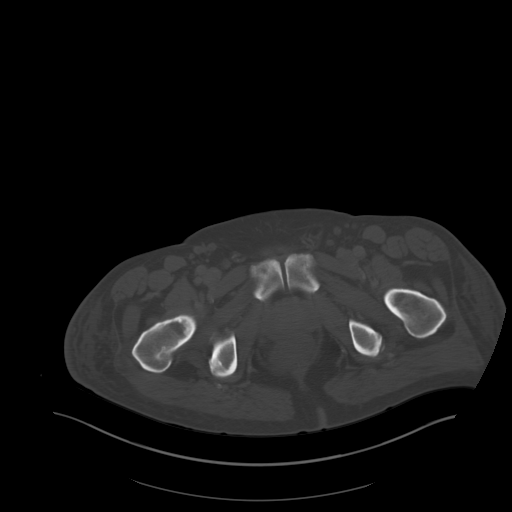
[im 17/113  soft-tissue]
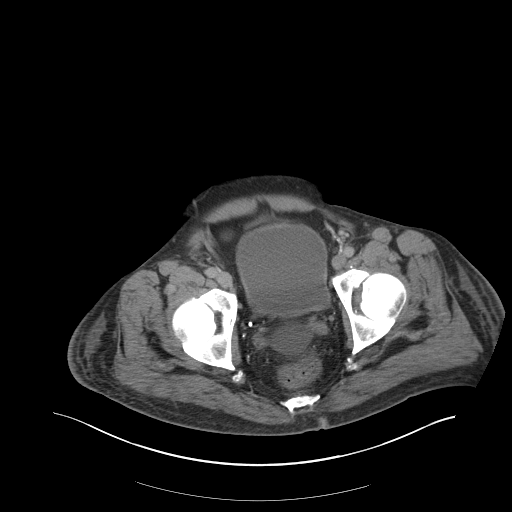
[im 25/113  soft-tissue]
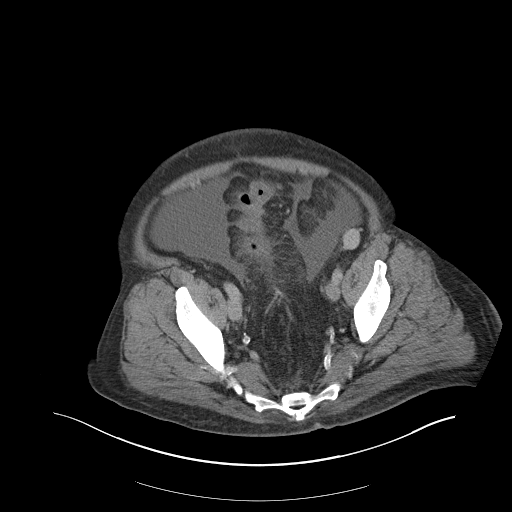
[im 33/113  soft-tissue]
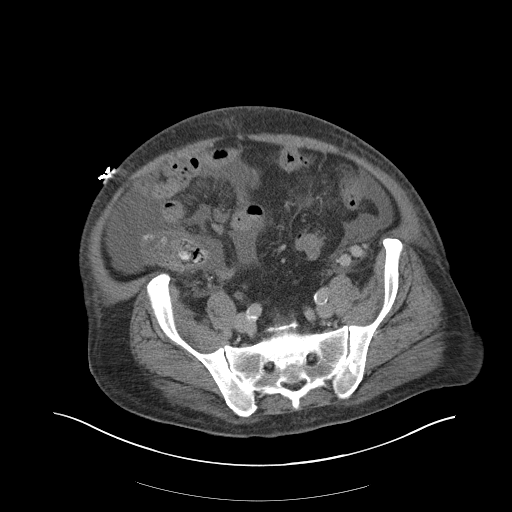
[im 41/113  soft-tissue]
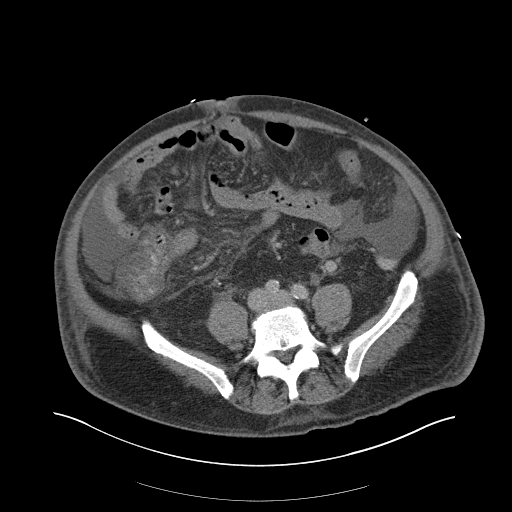
[im 49/113  soft-tissue]
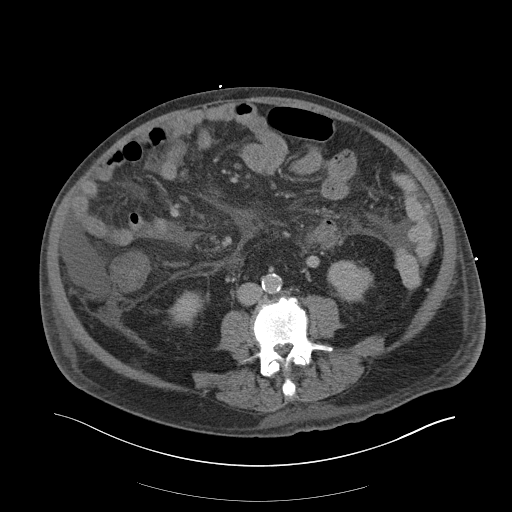
[im 65/113  soft-tissue]
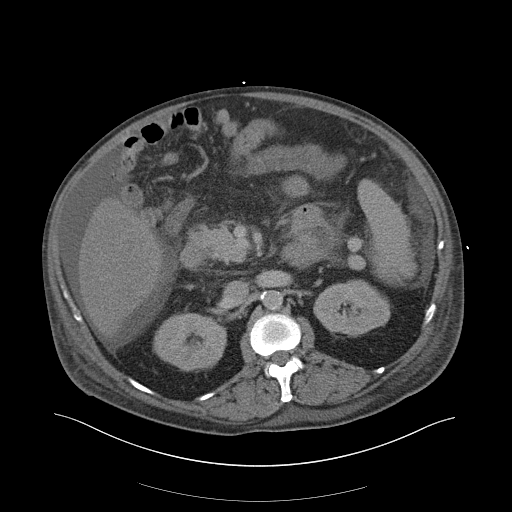
[im 73/113  soft-tissue]
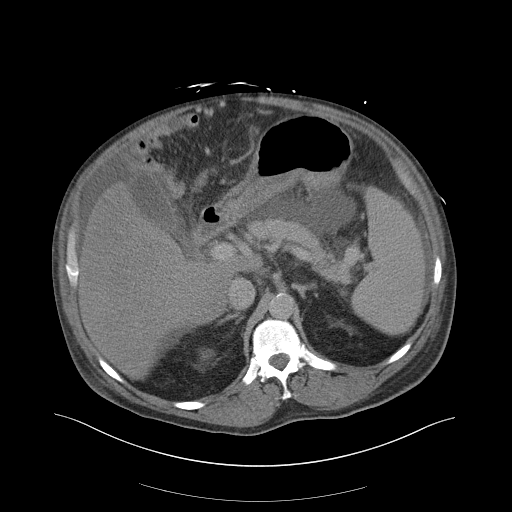
[im 81/113  soft-tissue]
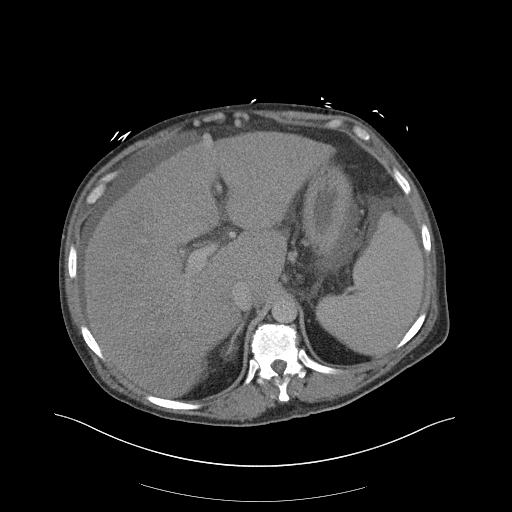
[im 81/113  bone]
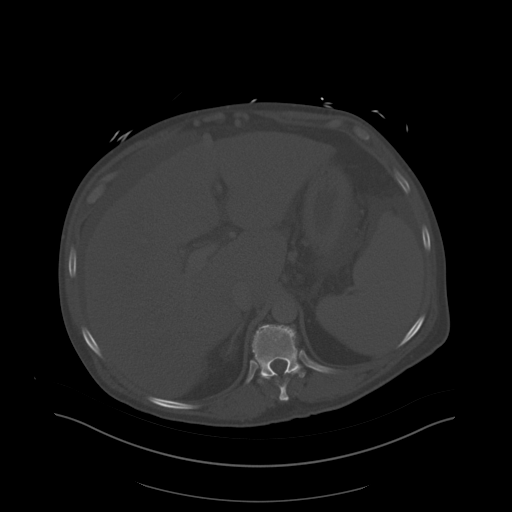
[im 89/113  soft-tissue]
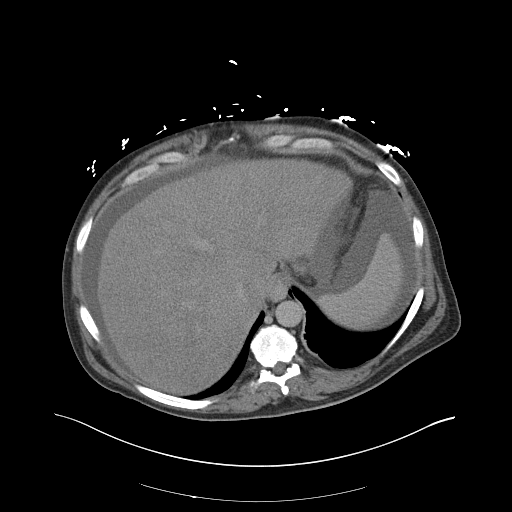
[im 97/113  soft-tissue]
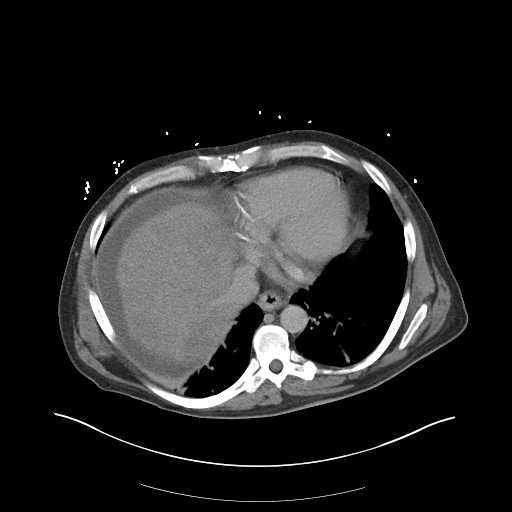
[im 105/113  soft-tissue]
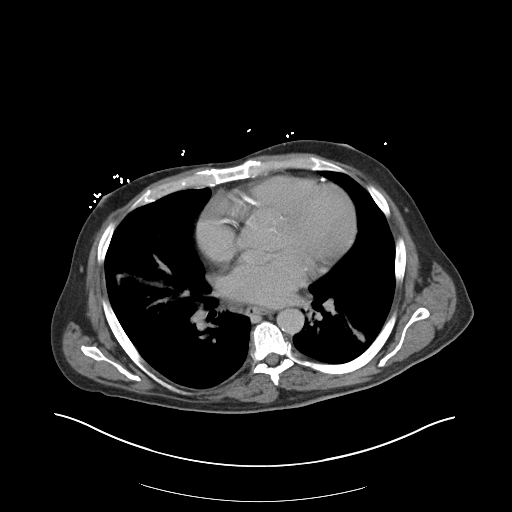

[Series 5: coronal a/|p · coronal · 0.93mm/px · 3 of 191 slices shown]
[im 64/191  soft-tissue]
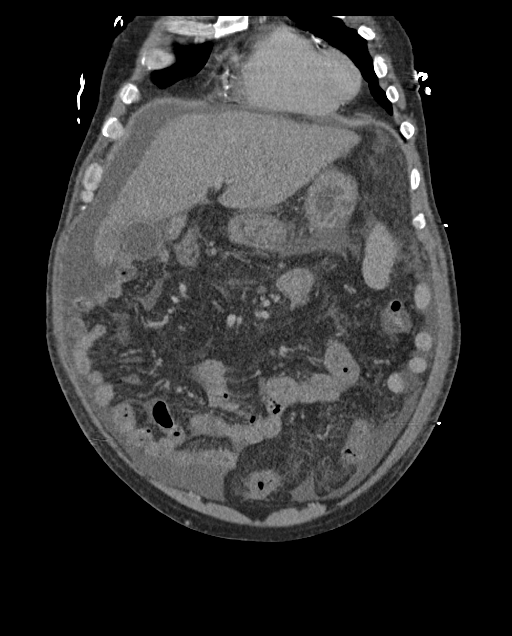
[im 85/191  soft-tissue]
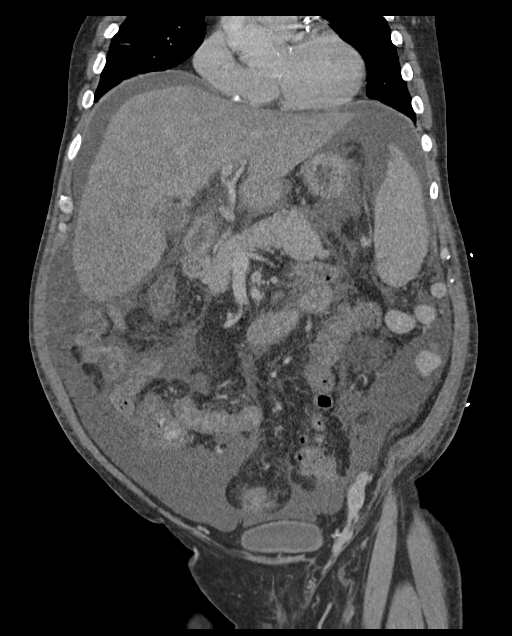
[im 106/191  soft-tissue]
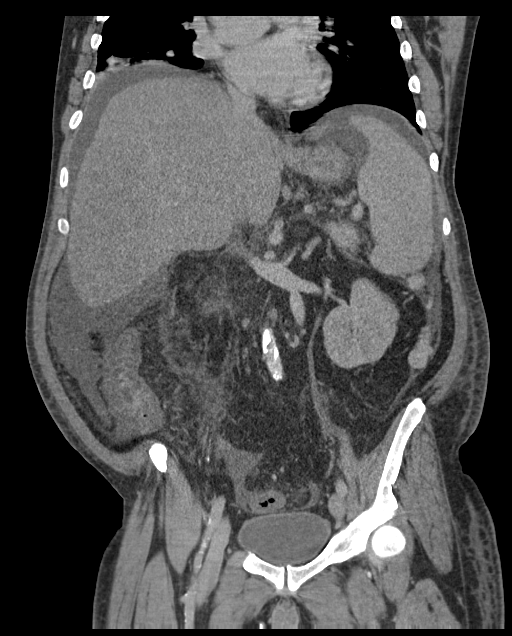

[15 of 46 positions shown; findings below may reference images not displayed]

FINDINGS: Lower chest: Scattered patchy and linear atelectatic changes present
within the visualized lung bases. Visualized lung bases are
otherwise clear. Extensive 3 vessel coronary artery calcifications
noted.

Hepatobiliary: The liver is heterogeneous in appearance with a
nodular contour and caudate lobe hypertrophy, compatible with
cirrhosis. Superimposed 2.4 cm hypodense lesion noted within the
central aspect of the liver (series 2, image 28), indeterminate. No
other definite focal lesions identified. Small granuloma noted.
Evidence of portal hypertension with the recannulized paraumbilical
vein. Large varix noted within the left abdomen as well. Few
scattered smaller varices present within the upper abdomen.

Trace hyperdensity within the gallbladder lumen, likely small stone.
No biliary dilatation.

Pancreas: Pancreas within normal limits.

Spleen: Spleen is enlarged measuring 15.2 cm in craniocaudad
dimension.

Adrenals/Urinary Tract: Adrenal glands are normal. Kidneys equal in
size with symmetric enhancement. No nephrolithiasis, hydronephrosis,
or focal enhancing renal mass. No hydroureter. Bladder within normal
limits.

Stomach/Bowel: Stomach within normal limits. No evidence for bowel
obstruction. Scattered hazy stranding and fluid about the small and
large bowel felt to be related to underlying intrinsic liver
disease. Colonic diverticulosis without definite evidence for acute
diverticulitis. Appendix is normal. No definite acute inflammatory
changes about the bowels.

Vascular/Lymphatic: Extensive aorto bi-iliac atherosclerotic
disease. No aneurysm. No pathologically enlarged intra-abdominal or
pelvic lymph nodes.

Reproductive: Prostate somewhat enlarged with median lobe
hypertrophy, invaginating upon the base of the bladder.

Other: No free intraperitoneal air. Moderate volume ascites within
the abdomen and pelvis. Small amount of fluid extends through a
small paraumbilical hernia.

Musculoskeletal:  Anasarca noted.

No acute osseous abnormality. No worrisome lytic or blastic osseous
lesions. Degenerative spondylolysis noted within the lumbar spine,
greatest at L5-S1.
IMPRESSION: 1. Cirrhosis with moderate volume ascites and evidence of portal
hypertension as detailed above.
2. 2.4 cm hypodense lesion within the central aspect of the liver,
indeterminate. Given the underlying intrinsic liver disease, further
evaluation with dedicated MRI is recommended.
3. Colonic diverticulosis without definite evidence for acute
diverticulitis.
4. Cholelithiasis.
5. Extensive coronary artery calcifications with moderate aorto
bi-iliac atherosclerotic disease.

## 2018-03-11 IMAGING — US US PARACENTESIS
1 series · 8 of 8 positions shown · non-contrast
Comparison: none

INDICATION: Jaundice, elevated liver function tests, cirrhosis, liver lesion,
ascites. Request made for diagnostic and therapeutic paracentesis.

[Series 1: us paracentesis · 0.26mm/px · 8 of 8 slices shown]
[im 1/8]
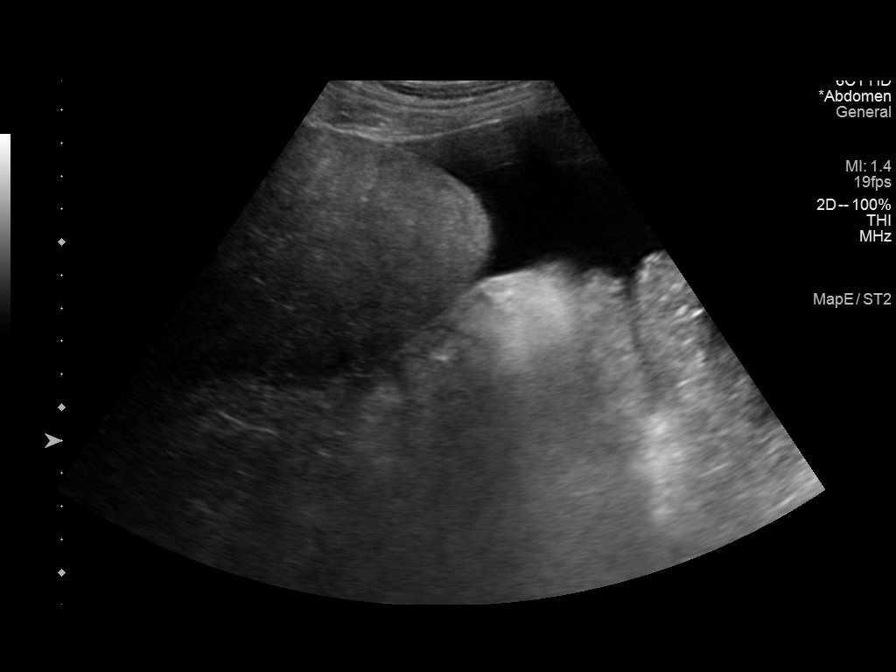
[im 2/8]
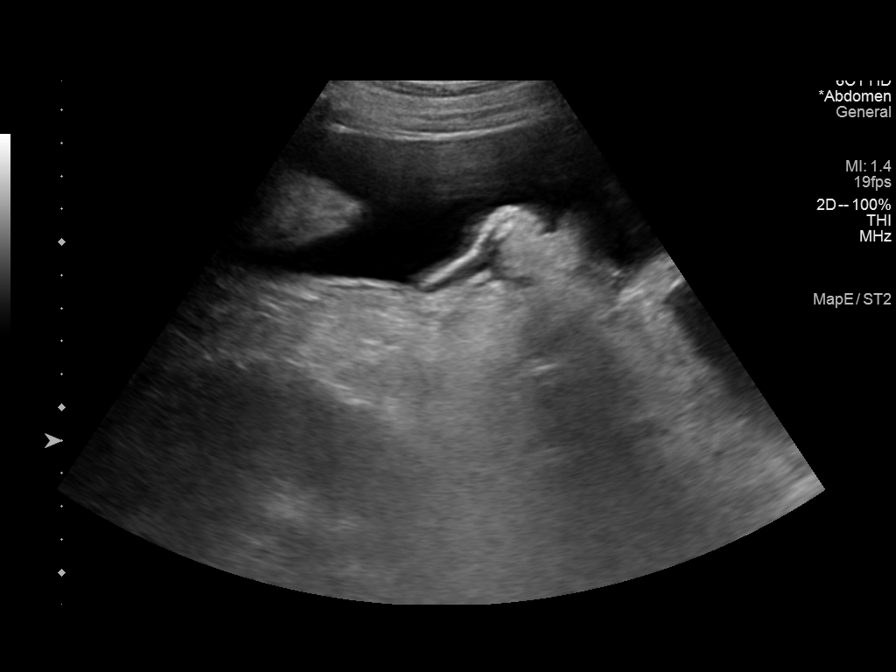
[im 3/8]
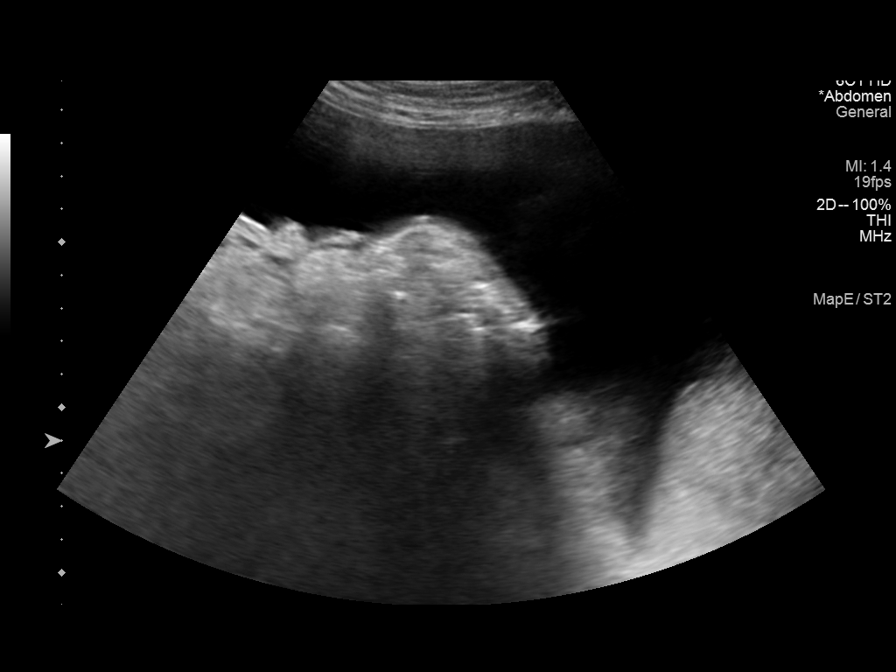
[im 4/8]
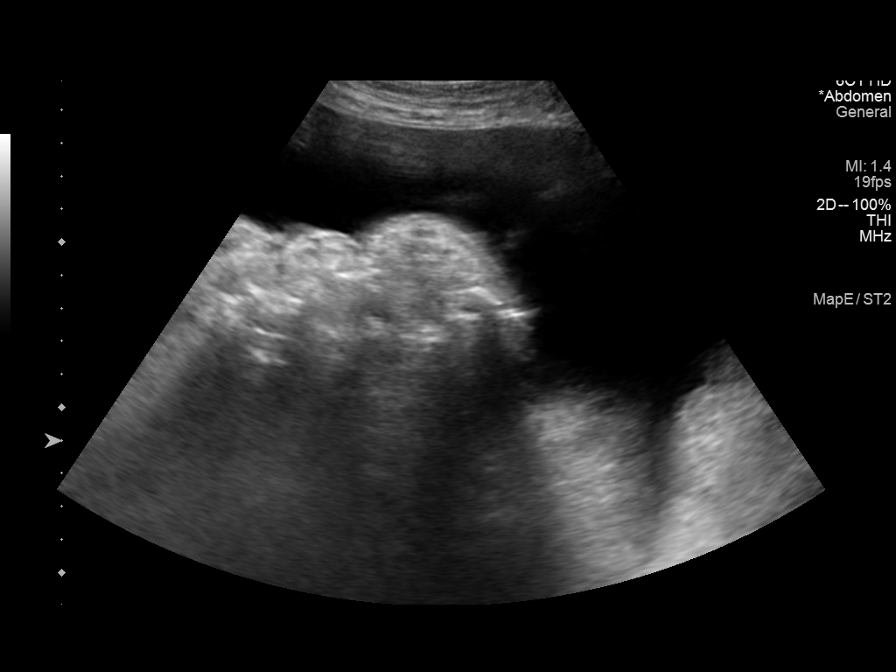
[im 5/8]
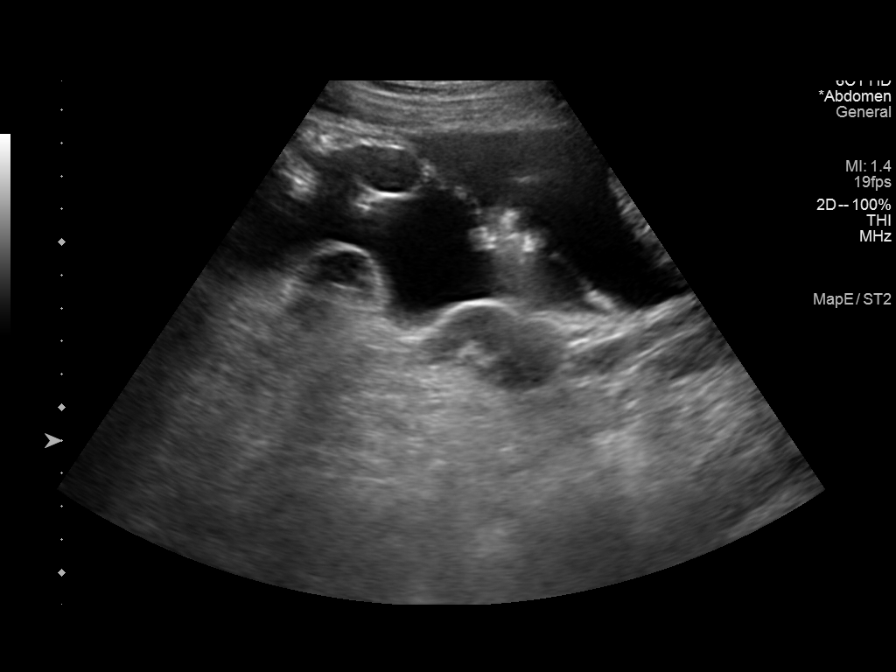
[im 6/8]
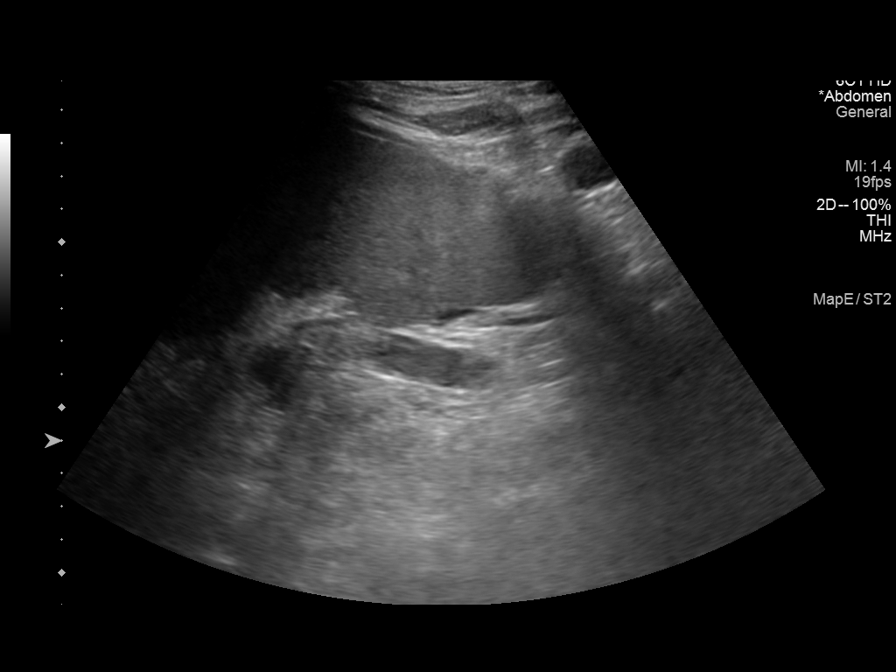
[im 7/8]
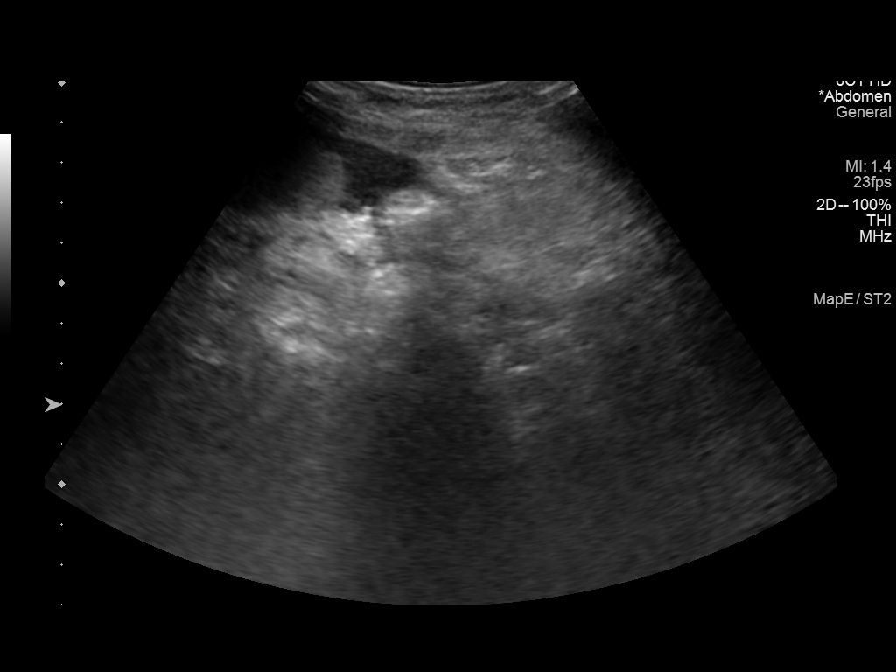
[im 8/8]
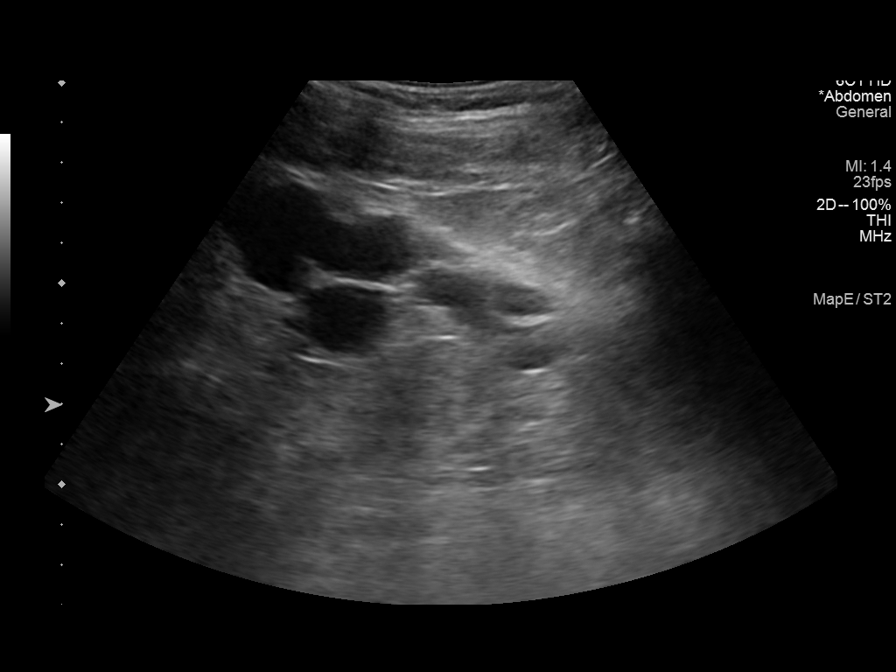

[8 of 8 positions shown; findings below may reference images not displayed]

EXAM:
ULTRASOUND GUIDED DIAGNOSTIC AND THERAPEUTIC PARACENTESIS

MEDICATIONS:
None.

COMPLICATIONS:
None immediate.

PROCEDURE:
Informed written consent was obtained from the patient after a
discussion of the risks, benefits and alternatives to treatment. A
timeout was performed prior to the initiation of the procedure.

Initial ultrasound scanning demonstrates a small to moderate amount
of ascites within the right lower abdominal quadrant. The right
lower abdomen was prepped and draped in the usual sterile fashion.
1% lidocaine was used for local anesthesia.

Following this, a Yueh catheter was introduced. An ultrasound image
was saved for documentation purposes. The paracentesis was
performed. The catheter was removed and a dressing was applied. The
patient tolerated the procedure well without immediate post
procedural complication.
FINDINGS: A total of approximately 2.9 liters of golden yellow fluid was
removed. Samples were sent to the laboratory as requested by the
clinical team.
IMPRESSION: Successful ultrasound-guided diagnostic and therapeutic paracentesis
yielding 2.9 liters of peritoneal fluid.

## 2021-07-21 DEATH — deceased
